# Patient Record
Sex: Female | Born: 1975 | Race: White | Hispanic: No | Marital: Married | Smoking: Never smoker
Health system: Southern US, Community
[De-identification: ages and names within clinical notes are randomized; demographics above are authoritative.]

## PROBLEM LIST (undated history)

## (undated) DIAGNOSIS — N6320 Unspecified lump in the left breast, unspecified quadrant: Secondary | ICD-10-CM

## (undated) DIAGNOSIS — N871 Moderate cervical dysplasia: Secondary | ICD-10-CM

## (undated) DIAGNOSIS — Z8742 Personal history of other diseases of the female genital tract: Secondary | ICD-10-CM

## (undated) DIAGNOSIS — Z9889 Other specified postprocedural states: Secondary | ICD-10-CM

## (undated) HISTORY — DX: Unspecified lump in the left breast, unspecified quadrant: N63.20

## (undated) HISTORY — DX: Moderate cervical dysplasia: N87.1

## (undated) HISTORY — DX: Personal history of other diseases of the female genital tract: Z87.42

---

## 2008-07-12 ENCOUNTER — Emergency Department: Payer: Self-pay | Admitting: Emergency Medicine

## 2012-05-11 LAB — HM HIV SCREENING LAB: HM HIV Screening: NEGATIVE

## 2012-12-05 HISTORY — PX: CERVICAL BIOPSY  W/ LOOP ELECTRODE EXCISION: SUR135

## 2015-10-08 ENCOUNTER — Other Ambulatory Visit: Payer: Self-pay | Admitting: Obstetrics and Gynecology

## 2015-10-08 DIAGNOSIS — N6320 Unspecified lump in the left breast, unspecified quadrant: Secondary | ICD-10-CM

## 2015-10-21 ENCOUNTER — Encounter (HOSPITAL_COMMUNITY): Payer: Self-pay

## 2015-10-21 ENCOUNTER — Ambulatory Visit
Admission: RE | Admit: 2015-10-21 | Discharge: 2015-10-21 | Disposition: A | Discharge status: Home / Self Care | Payer: Commercial Managed Care - PPO | Source: Ambulatory Visit | Attending: Obstetrics and Gynecology | Admitting: Obstetrics and Gynecology

## 2015-10-21 DIAGNOSIS — N6321 Unspecified lump in the left breast, upper outer quadrant: Secondary | ICD-10-CM | POA: Diagnosis present

## 2015-10-21 DIAGNOSIS — N6320 Unspecified lump in the left breast, unspecified quadrant: Secondary | ICD-10-CM

## 2015-11-10 ENCOUNTER — Other Ambulatory Visit: Payer: Self-pay | Admitting: Obstetrics and Gynecology

## 2015-11-10 DIAGNOSIS — N63 Unspecified lump in unspecified breast: Secondary | ICD-10-CM

## 2016-04-21 ENCOUNTER — Ambulatory Visit
Admission: RE | Admit: 2016-04-21 | Discharge: 2016-04-21 | Disposition: A | Discharge status: Home / Self Care | Payer: Commercial Managed Care - PPO | Source: Ambulatory Visit | Attending: Obstetrics and Gynecology | Admitting: Obstetrics and Gynecology

## 2016-04-21 DIAGNOSIS — N63 Unspecified lump in unspecified breast: Secondary | ICD-10-CM

## 2016-04-21 DIAGNOSIS — N6320 Unspecified lump in the left breast, unspecified quadrant: Secondary | ICD-10-CM | POA: Insufficient documentation

## 2016-04-25 NOTE — Progress Notes (Signed)
Message to Dr Glennon Mac to put orders in [facility].

## 2016-04-26 ENCOUNTER — Encounter: Payer: Self-pay | Admitting: Obstetrics and Gynecology

## 2016-04-26 ENCOUNTER — Other Ambulatory Visit: Payer: Self-pay | Admitting: Obstetrics and Gynecology

## 2016-04-26 ENCOUNTER — Telehealth: Payer: Self-pay | Admitting: Obstetrics and Gynecology

## 2016-04-26 DIAGNOSIS — N6321 Unspecified lump in the left breast, upper outer quadrant: Secondary | ICD-10-CM | POA: Insufficient documentation

## 2016-04-26 DIAGNOSIS — N6320 Unspecified lump in the left breast, unspecified quadrant: Secondary | ICD-10-CM

## 2016-04-26 HISTORY — DX: Unspecified lump in the left breast, unspecified quadrant: N63.20

## 2016-04-26 NOTE — Telephone Encounter (Signed)
Patient is aware of appointment at [facility] on [address], 10/25/16 @ 10:40am.

## 2016-05-19 ENCOUNTER — Ambulatory Visit (INDEPENDENT_AMBULATORY_CARE_PROVIDER_SITE_OTHER): Payer: Commercial Managed Care - PPO | Admitting: Certified Nurse Midwife

## 2016-05-19 ENCOUNTER — Encounter: Payer: Self-pay | Admitting: Certified Nurse Midwife

## 2016-05-19 ENCOUNTER — Other Ambulatory Visit: Payer: Self-pay | Admitting: Certified Nurse Midwife

## 2016-05-19 ENCOUNTER — Ambulatory Visit (INDEPENDENT_AMBULATORY_CARE_PROVIDER_SITE_OTHER): Payer: Commercial Managed Care - PPO

## 2016-05-19 VITALS — BP 122/72 | HR 101 | Ht 62.0 in | Wt 192.0 lb

## 2016-05-19 DIAGNOSIS — O3442 Maternal care for other abnormalities of cervix, second trimester: Secondary | ICD-10-CM | POA: Diagnosis not present

## 2016-05-19 DIAGNOSIS — O09529 Supervision of elderly multigravida, unspecified trimester: Secondary | ICD-10-CM | POA: Diagnosis not present

## 2016-05-19 DIAGNOSIS — R1030 Lower abdominal pain, unspecified: Secondary | ICD-10-CM

## 2016-05-19 DIAGNOSIS — Z3492 Encounter for supervision of normal pregnancy, unspecified, second trimester: Secondary | ICD-10-CM

## 2016-05-19 DIAGNOSIS — Z9889 Other specified postprocedural states: Secondary | ICD-10-CM

## 2016-05-19 DIAGNOSIS — O26842 Uterine size-date discrepancy, second trimester: Secondary | ICD-10-CM | POA: Diagnosis not present

## 2016-05-19 DIAGNOSIS — N926 Irregular menstruation, unspecified: Secondary | ICD-10-CM

## 2016-05-19 DIAGNOSIS — Z8279 Family history of other congenital malformations, deformations and chromosomal abnormalities: Secondary | ICD-10-CM

## 2016-05-19 LAB — POCT URINALYSIS DIPSTICK
Bilirubin, UA: NEGATIVE
Blood, UA: NEGATIVE
Glucose, UA: NEGATIVE
Ketones, UA: NEGATIVE
Leukocytes, UA: NEGATIVE
Nitrite, UA: NEGATIVE
Protein, UA: NEGATIVE
Spec Grav, UA: 1.01 (ref 1.010–1.025)
Urobilinogen, UA: NEGATIVE U/dL — AB
pH, UA: 6 (ref 5.0–8.0)

## 2016-05-19 LAB — POCT URINE PREGNANCY: Preg Test, Ur: POSITIVE — AB

## 2016-05-19 NOTE — Progress Notes (Signed)
Obstetrics & Gynecology Office Visit   Chief Complaint:  Chief Complaint  Patient presents with  . Back Pain    back pain and cramping, recent +UPT    History of Present Illness: 41 year old G 4P2012 with LMP the end of February? presents with a positive urine pregnancy test , back pain and cramping. Was having monthly menses until December at which time she began having more frequent menses and sometimes the menses would last 2 weeks. Is unsure of her LMP. Had a positive UPT 3 weeks ago. Since then has had bilateral SI joint pain, lower abdominal cramping and she thinks she is feeling baby moving. She has gained 20# since December, despite taking phenteramine for wight loss. Contraception has been condoms. Stopped phenteramine with + UPT. Started taking prenatal vitamins. Has not drunk an alcohol since February. Does not smoke.  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  Respiratory: Positive for cough. Negative for shortness of breath.   Cardiovascular: Negative for chest pain.  Gastrointestinal: Positive for abdominal pain (cramping). Negative for nausea and vomiting.  Genitourinary: Negative for dysuria and hematuria.  Musculoskeletal: Positive for back pain.  Skin: Negative for rash.  Neurological: Negative for dizziness and headaches.  Psychiatric/Behavioral: The patient is nervous/anxious.      Past Medical History:  Past Medical History:  Diagnosis Date  . CIN II (cervical intraepithelial neoplasia II)   . History of abnormal cervical Pap smear   . Left breast mass 04/26/2016    Past Surgical History:  Past Surgical History:  Procedure Laterality Date  . CERVICAL BIOPSY  W/ LOOP ELECTRODE EXCISION  12/05/2012   CONE LEEP    Gynecologic History: Patient's last menstrual period was 03/17/2016 (approximate).  Obstetric History: [id number] Obstetrical  History remarkable for spontaneous vaginal deliveries in 1997 and 2000 delivering 6#1oz and 7# daughters. Had mild PIH with first baby. Family History:  Family History  Problem Relation Age of Onset  . Breast cancer Maternal Grandmother 71    cancer of the gall bladder  . Colon cancer Mother 16  . Lung cancer Maternal Uncle     Social History:  Social History   Social History  . Marital status: Married    Spouse name: N/A  . Number of children: 2  . Years of education: N/A   Occupational History  . Not on file.   Social History Main Topics  . Smoking status: Never Smoker  . Smokeless tobacco: Never Used  . Alcohol use No  . Drug use: No  . Sexual activity: Yes    Birth control/ protection: None, Condom   Other Topics Concern  . Not on file   Social History Narrative  . No narrative on file    Allergies:  No Known Allergies  Medications: Prenatal vitamins. Stopped phenteramine 3 weeks ago Physical Exam Vitals: BP 122/72   Pulse (!) 101   Ht 5\' 2"  (1.575 m)   Wt 87.1 kg (192 lb)   LMP 03/17/2016 (Approximate) Comment: unsure dates  BMI 35.12 kg/m    Physical Exam  Constitutional: She is oriented to person, place, and time. She appears well-developed and well-nourished. No distress.  HENT:  Head: Normocephalic and atraumatic.  Neck: Normal range of motion.  Respiratory: Effort normal.  GI: Soft.  Fundus palpable above umbilicus. FH 22 cm. FHTs WNL. No hepatomegaly, NT  Musculoskeletal: Normal range of motion.  Mild tenderness in the SI joints bilaterlly  Neurological: She is alert and oriented to person, place, and time.  Skin: Skin is warm and dry. No rash noted.  Psychiatric: She has a normal mood and affect. Her behavior is normal. Thought content normal.   Results for orders placed or performed in visit on 05/19/16 (from the past 24 hour(s))  POCT urine pregnancy     Status: Abnormal   Collection Time: 05/19/16 10:21 AM  Result Value Ref Range    Preg Test, Ur Positive (A) Negative  POCT Urinalysis Dipstick     Status: Normal   Collection Time: 05/19/16 10:22 AM  Result Value Ref Range   Color, UA yellow    Clarity, UA clear    Glucose, UA neg    Bilirubin, UA neg    Ketones, UA neg    Spec Grav, UA 1.010 1.010 - 1.025   Blood, UA neg    pH, UA 6.0 5.0 - 8.0   Protein, UA neg    Urobilinogen, UA negative (A) 0.2 or 1.0 E.U./dL   Nitrite, UA neg    Leukocytes, UA Negative Negative   Assessment: 41 y.o. [id number] with late onset care Size of uterus c/w second trimester Advanced maternal age Hx of LEEP  Plan: Problem List Items Addressed This Visit    None    Visit Diagnoses    Missed period    -  Primary   Relevant Orders   POCT urine pregnancy (Completed)   POCT Urinalysis Dipstick (Completed)   US OB Comp + 14 Wk   Lower abdominal pain       Relevant Orders   POCT Urinalysis Dipstick (Completed)   Encounter for supervision of high risk multigravida of advanced maternal age, antepartum       Relevant Orders   RPR+Rh+ABO+Rub Ab+Ab Scr+CB...   Comprehensive metabolic panel   informaSeq(SM) with XY Analysis   US OB Comp + 14 Wk   History of LEEP (loop electrosurgical excision procedure) of cervix complicating pregnancy in second trimester       Relevant Orders   US OB Comp + 14 Wk   Size of fetus inconsistent with dates in second trimester       Relevant Orders   US OB Comp + 14 Wk     Ultrasound today reveals SIUP with CGA 18wk2 days and EDC=10/18/2016. Low lying placenta within 1 cm of cx os. Cervical length 5.32 cm  NOB labs today along with cell free DNA testing due to AMA Already scheduled for NOB H&P in 2 weeks. Will complete anatomy scan then and repeat cervical length. Refer to [facility] for fetal echo due to maternal age>40 and history of FOB's child by previous marriage with valvular abnormality not needing surgical repair and fetal exposure to phenteramine. Given handouts on diet, genetic testing, safe  medications. Dalia Heading, CNM

## 2016-05-20 ENCOUNTER — Encounter: Payer: Self-pay | Admitting: Certified Nurse Midwife

## 2016-05-20 DIAGNOSIS — O4442 Low lying placenta NOS or without hemorrhage, second trimester: Secondary | ICD-10-CM | POA: Insufficient documentation

## 2016-05-20 LAB — COMPREHENSIVE METABOLIC PANEL
ALK PHOS: 113 IU/L (ref 39–117)
ALT: 30 IU/L (ref 0–32)
AST: 19 IU/L (ref 0–40)
Albumin/Globulin Ratio: 1.1 — ABNORMAL LOW (ref 1.2–2.2)
Albumin: 3.2 g/dL — ABNORMAL LOW (ref 3.5–5.5)
BUN/Creatinine Ratio: 15 (ref 9–23)
BUN: 7 mg/dL (ref 6–24)
Bilirubin Total: <0.2 mg/dL (ref 0.0–1.2)
CALCIUM: 9.2 mg/dL (ref 8.7–10.2)
CO2: 21 mmol/L (ref 18–29)
Chloride: 105 mmol/L (ref 96–106)
Creatinine, Ser: 0.48 mg/dL — ABNORMAL LOW (ref 0.57–1.00)
GFR calc Af Amer: 141 mL/min/{1.73_m2} (ref >59)
GFR, EST NON AFRICAN AMERICAN: 122 mL/min/{1.73_m2} (ref >59)
GLOBULIN, TOTAL: 3 g/dL (ref 1.5–4.5)
GLUCOSE: 106 mg/dL — AB (ref 65–99)
Potassium: 3.9 mmol/L (ref 3.5–5.2)
Sodium: 141 mmol/L (ref 134–144)
Total Protein: 6.2 g/dL (ref 6.0–8.5)

## 2016-05-20 LAB — RPR+RH+ABO+RUB AB+AB SCR+CB...
ANTIBODY SCREEN: NEGATIVE
HEMATOCRIT: 32.3 % — AB (ref 34.0–46.6)
HIV Screen 4th Generation wRfx: NONREACTIVE
Hemoglobin: 10.8 g/dL — ABNORMAL LOW (ref 11.1–15.9)
Hepatitis B Surface Ag: NEGATIVE
MCH: 29.5 pg (ref 26.6–33.0)
MCHC: 33.4 g/dL (ref 31.5–35.7)
MCV: 88 fL (ref 79–97)
PLATELETS: 214 10*3/uL (ref 150–379)
RBC: 3.66 x10E6/uL — ABNORMAL LOW (ref 3.77–5.28)
RDW: 14.3 % (ref 12.3–15.4)
RH TYPE: POSITIVE
RPR: NONREACTIVE
Rubella Antibodies, IGG: 1.65 index (ref >0.99)
Varicella zoster IgG: 3049 index (ref >165)
WBC: 14.4 10*3/uL — ABNORMAL HIGH (ref 3.4–10.8)

## 2016-06-01 ENCOUNTER — Ambulatory Visit (INDEPENDENT_AMBULATORY_CARE_PROVIDER_SITE_OTHER): Payer: Commercial Managed Care - PPO | Admitting: Advanced Practice Midwife

## 2016-06-01 ENCOUNTER — Ambulatory Visit (INDEPENDENT_AMBULATORY_CARE_PROVIDER_SITE_OTHER): Payer: Commercial Managed Care - PPO

## 2016-06-01 VITALS — BP 124/74 | Wt 197.0 lb

## 2016-06-01 DIAGNOSIS — Z1379 Encounter for other screening for genetic and chromosomal anomalies: Secondary | ICD-10-CM

## 2016-06-01 DIAGNOSIS — O09529 Supervision of elderly multigravida, unspecified trimester: Secondary | ICD-10-CM

## 2016-06-01 DIAGNOSIS — Z3A20 20 weeks gestation of pregnancy: Secondary | ICD-10-CM

## 2016-06-01 DIAGNOSIS — Z113 Encounter for screening for infections with a predominantly sexual mode of transmission: Secondary | ICD-10-CM

## 2016-06-01 DIAGNOSIS — N926 Irregular menstruation, unspecified: Secondary | ICD-10-CM

## 2016-06-01 DIAGNOSIS — O3442 Maternal care for other abnormalities of cervix, second trimester: Secondary | ICD-10-CM | POA: Diagnosis not present

## 2016-06-01 DIAGNOSIS — Z0489 Encounter for examination and observation for other specified reasons: Secondary | ICD-10-CM

## 2016-06-01 DIAGNOSIS — Z9889 Other specified postprocedural states: Secondary | ICD-10-CM

## 2016-06-01 DIAGNOSIS — IMO0002 Reserved for concepts with insufficient information to code with codable children: Secondary | ICD-10-CM

## 2016-06-01 DIAGNOSIS — Z048 Encounter for examination and observation for other specified reasons: Secondary | ICD-10-CM

## 2016-06-01 DIAGNOSIS — O26842 Uterine size-date discrepancy, second trimester: Secondary | ICD-10-CM

## 2016-06-01 NOTE — Progress Notes (Signed)
Anatomy scan today incomplete- unable to see report as of this note. F/U scan nv.

## 2016-06-04 LAB — GC/CHLAMYDIA PROBE AMP
CHLAMYDIA, DNA PROBE: NEGATIVE
Neisseria gonorrhoeae by PCR: NEGATIVE

## 2016-06-07 LAB — INFORMASEQ(SM) WITH XY ANALYSIS
Fetal Fraction (%):: 5.8
Fetal Number: 1
Gestational Age at Collection: 20.1 wk
Weight: 197 [lb_av]

## 2016-06-08 ENCOUNTER — Ambulatory Visit: Payer: Self-pay | Admitting: Certified Nurse Midwife

## 2016-06-15 ENCOUNTER — Telehealth: Payer: Self-pay | Admitting: Obstetrics and Gynecology

## 2016-06-15 NOTE — Telephone Encounter (Signed)
Pt is calling to find out about appointment possibly at [place] [facility] for ultrasound of the Heart of her baby. Would you please advise. CB# [phone]

## 2016-06-15 NOTE — Telephone Encounter (Signed)
Pt aware of appt for tomorrow morning for fetal echo in [place]. Needed address again.

## 2016-06-29 ENCOUNTER — Encounter: Payer: Commercial Managed Care - PPO | Admitting: Obstetrics and Gynecology

## 2016-06-29 ENCOUNTER — Other Ambulatory Visit: Payer: Commercial Managed Care - PPO

## 2016-06-30 ENCOUNTER — Ambulatory Visit (INDEPENDENT_AMBULATORY_CARE_PROVIDER_SITE_OTHER): Payer: Commercial Managed Care - PPO

## 2016-06-30 ENCOUNTER — Ambulatory Visit (INDEPENDENT_AMBULATORY_CARE_PROVIDER_SITE_OTHER): Payer: Commercial Managed Care - PPO | Admitting: Obstetrics and Gynecology

## 2016-06-30 VITALS — BP 146/88 | Wt 206.0 lb

## 2016-06-30 DIAGNOSIS — Z362 Encounter for other antenatal screening follow-up: Secondary | ICD-10-CM | POA: Diagnosis not present

## 2016-06-30 DIAGNOSIS — Z3A24 24 weeks gestation of pregnancy: Secondary | ICD-10-CM

## 2016-06-30 DIAGNOSIS — Z0489 Encounter for examination and observation for other specified reasons: Secondary | ICD-10-CM

## 2016-06-30 DIAGNOSIS — Z131 Encounter for screening for diabetes mellitus: Secondary | ICD-10-CM

## 2016-06-30 DIAGNOSIS — O3442 Maternal care for other abnormalities of cervix, second trimester: Secondary | ICD-10-CM

## 2016-06-30 DIAGNOSIS — O09529 Supervision of elderly multigravida, unspecified trimester: Secondary | ICD-10-CM

## 2016-06-30 DIAGNOSIS — O4442 Low lying placenta NOS or without hemorrhage, second trimester: Secondary | ICD-10-CM

## 2016-06-30 DIAGNOSIS — Z9889 Other specified postprocedural states: Secondary | ICD-10-CM

## 2016-06-30 DIAGNOSIS — Z113 Encounter for screening for infections with a predominantly sexual mode of transmission: Secondary | ICD-10-CM

## 2016-06-30 DIAGNOSIS — IMO0002 Reserved for concepts with insufficient information to code with codable children: Secondary | ICD-10-CM

## 2016-06-30 NOTE — Progress Notes (Signed)
Repeat anatomy scan today, now complete. No vb. No lof.  RVOT not seen, but normal on ECHO.  28 wk labs nv

## 2016-07-28 ENCOUNTER — Other Ambulatory Visit: Payer: Commercial Managed Care - PPO

## 2016-07-28 ENCOUNTER — Ambulatory Visit (INDEPENDENT_AMBULATORY_CARE_PROVIDER_SITE_OTHER): Payer: Commercial Managed Care - PPO | Admitting: Certified Nurse Midwife

## 2016-07-28 VITALS — BP 108/68 | Wt 209.0 lb

## 2016-07-28 DIAGNOSIS — Z113 Encounter for screening for infections with a predominantly sexual mode of transmission: Secondary | ICD-10-CM

## 2016-07-28 DIAGNOSIS — O09529 Supervision of elderly multigravida, unspecified trimester: Secondary | ICD-10-CM

## 2016-07-28 DIAGNOSIS — Z131 Encounter for screening for diabetes mellitus: Secondary | ICD-10-CM

## 2016-07-28 DIAGNOSIS — Z3A28 28 weeks gestation of pregnancy: Secondary | ICD-10-CM

## 2016-07-28 NOTE — Progress Notes (Signed)
28 week labs today (O POS) Some BH contractions Thinking about breast feeding/ encouraged to attend breast feeding classes Good FM ROB and TDAP in 2 weeks Growth scan and ROB in 4 weeks

## 2016-07-28 NOTE — Progress Notes (Signed)
28 wk labs today. Pt c/o pressure in lower stomach when trying to walk for exercise.

## 2016-07-29 LAB — 28 WEEK RH+PANEL
BASOS: 0 %
Basophils Absolute: 0 10*3/uL (ref 0.0–0.2)
EOS (ABSOLUTE): 0.2 10*3/uL (ref 0.0–0.4)
EOS: 2 %
Gestational Diabetes Screen: 132 mg/dL (ref 65–139)
HEMOGLOBIN: 10.7 g/dL — AB (ref 11.1–15.9)
HIV SCREEN 4TH GENERATION: NONREACTIVE
Hematocrit: 32.3 % — ABNORMAL LOW (ref 34.0–46.6)
IMMATURE GRANS (ABS): 0.1 10*3/uL (ref 0.0–0.1)
Immature Granulocytes: 1 %
Lymphocytes Absolute: 2.7 10*3/uL (ref 0.7–3.1)
Lymphs: 21 %
MCH: 30 pg (ref 26.6–33.0)
MCHC: 33.1 g/dL (ref 31.5–35.7)
MCV: 91 fL (ref 79–97)
MONOCYTES: 5 %
Monocytes Absolute: 0.6 10*3/uL (ref 0.1–0.9)
NEUTROS ABS: 9.5 10*3/uL — AB (ref 1.4–7.0)
Neutrophils: 71 %
Platelets: 205 10*3/uL (ref 150–379)
RBC: 3.57 x10E6/uL — ABNORMAL LOW (ref 3.77–5.28)
RDW: 14 % (ref 12.3–15.4)
RPR Ser Ql: NONREACTIVE
WBC: 13.1 10*3/uL — AB (ref 3.4–10.8)

## 2016-08-12 ENCOUNTER — Ambulatory Visit (INDEPENDENT_AMBULATORY_CARE_PROVIDER_SITE_OTHER): Payer: Commercial Managed Care - PPO | Admitting: Obstetrics and Gynecology

## 2016-08-12 VITALS — BP 112/62 | Wt 217.0 lb

## 2016-08-12 DIAGNOSIS — Z23 Encounter for immunization: Secondary | ICD-10-CM

## 2016-08-12 DIAGNOSIS — O09529 Supervision of elderly multigravida, unspecified trimester: Secondary | ICD-10-CM

## 2016-08-12 DIAGNOSIS — Z3A30 30 weeks gestation of pregnancy: Secondary | ICD-10-CM

## 2016-08-12 NOTE — Progress Notes (Signed)
Vaginal pressure Braxton hicks  TDAP/Blood form signed

## 2016-08-25 ENCOUNTER — Ambulatory Visit (INDEPENDENT_AMBULATORY_CARE_PROVIDER_SITE_OTHER): Payer: Commercial Managed Care - PPO

## 2016-08-25 ENCOUNTER — Ambulatory Visit (INDEPENDENT_AMBULATORY_CARE_PROVIDER_SITE_OTHER): Payer: Commercial Managed Care - PPO | Admitting: Obstetrics & Gynecology

## 2016-08-25 DIAGNOSIS — Z362 Encounter for other antenatal screening follow-up: Secondary | ICD-10-CM

## 2016-08-25 DIAGNOSIS — O09529 Supervision of elderly multigravida, unspecified trimester: Secondary | ICD-10-CM

## 2016-08-25 DIAGNOSIS — O403XX0 Polyhydramnios, third trimester, not applicable or unspecified: Secondary | ICD-10-CM | POA: Insufficient documentation

## 2016-08-25 NOTE — Patient Instructions (Signed)
Third Trimester of Pregnancy The third trimester is from week 28 through week 40 (months 7 through 9). The third trimester is a time when the unborn baby (fetus) is growing rapidly. At the end of the ninth month, the fetus is about 20 inches in length and weighs 6-10 pounds. Body changes during your third trimester Your body will continue to go through many changes during pregnancy. The changes vary from woman to woman. During the third trimester:  Your weight will continue to increase. You can expect to gain 25-35 pounds (11-16 kg) by the end of the pregnancy.  You may begin to get stretch marks on your hips, abdomen, and breasts.  You may urinate more often because the fetus is moving lower into your pelvis and pressing on your bladder.  You may develop or continue to have heartburn. This is caused by increased hormones that slow down muscles in the digestive tract.  You may develop or continue to have constipation because increased hormones slow digestion and cause the muscles that push waste through your intestines to relax.  You may develop hemorrhoids. These are swollen veins (varicose veins) in the rectum that can itch or be painful.  You may develop swollen, bulging veins (varicose veins) in your legs.  You may have increased body aches in the pelvis, back, or thighs. This is due to weight gain and increased hormones that are relaxing your joints.  You may have changes in your hair. These can include thickening of your hair, rapid growth, and changes in texture. Some women also have hair loss during or after pregnancy, or hair that feels dry or thin. Your hair will most likely return to normal after your baby is born.  Your breasts will continue to grow and they will continue to become tender. A yellow fluid (colostrum) may leak from your breasts. This is the first milk you are producing for your baby.  Your belly button may stick out.  You may notice more swelling in your hands,  face, or ankles.  You may have increased tingling or numbness in your hands, arms, and legs. The skin on your belly may also feel numb.  You may feel short of breath because of your expanding uterus.  You may have more problems sleeping. This can be caused by the size of your belly, increased need to urinate, and an increase in your body's metabolism.  You may notice the fetus "dropping," or moving lower in your abdomen (lightening).  You may have increased vaginal discharge.  You may notice your joints feel loose and you may have pain around your pelvic bone.  What to expect at prenatal visits You will have prenatal exams every 2 weeks until week 36. Then you will have weekly prenatal exams. During a routine prenatal visit:  You will be weighed to make sure you and the baby are growing normally.  Your blood pressure will be taken.  Your abdomen will be measured to track your baby's growth.  The fetal heartbeat will be listened to.  Any test results from the previous visit will be discussed.  You may have a cervical check near your due date to see if your cervix has softened or thinned (effaced).  You will be tested for Group B streptococcus. This happens between 35 and 37 weeks.  Your health care provider may ask you:  What your birth plan is.  How you are feeling.  If you are feeling the baby move.  If you have had   any abnormal symptoms, such as leaking fluid, bleeding, severe headaches, or abdominal cramping.  If you are using any tobacco products, including cigarettes, chewing tobacco, and electronic cigarettes.  If you have any questions.  Other tests or screenings that may be performed during your third trimester include:  Blood tests that check for low iron levels (anemia).  Fetal testing to check the health, activity level, and growth of the fetus. Testing is done if you have certain medical conditions or if there are problems during the  pregnancy.  Nonstress test (NST). This test checks the health of your baby to make sure there are no signs of problems, such as the baby not getting enough oxygen. During this test, a belt is placed around your belly. The baby is made to move, and its heart rate is monitored during movement.  What is false labor? False labor is a condition in which you feel small, irregular tightenings of the muscles in the womb (contractions) that usually go away with rest, changing position, or drinking water. These are called Braxton Hicks contractions. Contractions may last for hours, days, or even weeks before true labor sets in. If contractions come at regular intervals, become more frequent, increase in intensity, or become painful, you should see your health care provider. What are the signs of labor?  Abdominal cramps.  Regular contractions that start at 10 minutes apart and become stronger and more frequent with time.  Contractions that start on the top of the uterus and spread down to the lower abdomen and back.  Increased pelvic pressure and dull back pain.  A watery or bloody mucus discharge that comes from the vagina.  Leaking of amniotic fluid. This is also known as your "water breaking." It could be a slow trickle or a gush. Let your health care provider know if it has a color or strange odor. If you have any of these signs, call your health care provider right away, even if it is before your due date. Follow these instructions at home: Medicines  Follow your health care provider's instructions regarding medicine use. Specific medicines may be either safe or unsafe to take during pregnancy.  Take a prenatal vitamin that contains at least 600 micrograms (mcg) of folic acid.  If you develop constipation, try taking a stool softener if your health care provider approves. Eating and drinking  Eat a balanced diet that includes fresh fruits and vegetables, whole grains, good sources of protein  such as meat, eggs, or tofu, and low-fat dairy. Your health care provider will help you determine the amount of weight gain that is right for you.  Avoid raw meat and uncooked cheese. These carry germs that can cause birth defects in the baby.  If you have low calcium intake from food, talk to your health care provider about whether you should take a daily calcium supplement.  Eat four or five small meals rather than three large meals a day.  Limit foods that are high in fat and processed sugars, such as fried and sweet foods.  To prevent constipation: ? Drink enough fluid to keep your urine clear or pale yellow. ? Eat foods that are high in fiber, such as fresh fruits and vegetables, whole grains, and beans. Activity  Exercise only as directed by your health care provider. Most women can continue their usual exercise routine during pregnancy. Try to exercise for 30 minutes at least 5 days a week. Stop exercising if you experience uterine contractions.  Avoid heavy   lifting.  Do not exercise in extreme heat or humidity, or at high altitudes.  Wear low-heel, comfortable shoes.  Practice good posture.  You may continue to have sex unless your health care provider tells you otherwise. Relieving pain and discomfort  Take frequent breaks and rest with your legs elevated if you have leg cramps or low back pain.  Take warm sitz baths to soothe any pain or discomfort caused by hemorrhoids. Use hemorrhoid cream if your health care provider approves.  Wear a good support bra to prevent discomfort from breast tenderness.  If you develop varicose veins: ? Wear support pantyhose or compression stockings as told by your healthcare provider. ? Elevate your feet for 15 minutes, 3-4 times a day. Prenatal care  Write down your questions. Take them to your prenatal visits.  Keep all your prenatal visits as told by your health care provider. This is important. Safety  Wear your seat belt at  all times when driving.  Make a list of emergency phone numbers, including numbers for family, friends, the hospital, and police and fire departments. General instructions  Avoid cat litter boxes and soil used by cats. These carry germs that can cause birth defects in the baby. If you have a cat, ask someone to clean the litter box for you.  Do not travel far distances unless it is absolutely necessary and only with the approval of your health care provider.  Do not use hot tubs, steam rooms, or saunas.  Do not drink alcohol.  Do not use any products that contain nicotine or tobacco, such as cigarettes and e-cigarettes. If you need help quitting, ask your health care provider.  Do not use any medicinal herbs or unprescribed drugs. These chemicals affect the formation and growth of the baby.  Do not douche or use tampons or scented sanitary pads.  Do not cross your legs for long periods of time.  To prepare for the arrival of your baby: ? Take prenatal classes to understand, practice, and ask questions about labor and delivery. ? Make a trial run to the hospital. ? Visit the hospital and tour the maternity area. ? Arrange for maternity or paternity leave through employers. ? Arrange for family and friends to take care of pets while you are in the hospital. ? Purchase a rear-facing car seat and make sure you know how to install it in your car. ? Pack your hospital bag. ? Prepare the baby's nursery. Make sure to remove all pillows and stuffed animals from the baby's crib to prevent suffocation.  Visit your dentist if you have not gone during your pregnancy. Use a soft toothbrush to brush your teeth and be gentle when you floss. Contact a health care provider if:  You are unsure if you are in labor or if your water has broken.  You become dizzy.  You have mild pelvic cramps, pelvic pressure, or nagging pain in your abdominal area.  You have lower back pain.  You have persistent  nausea, vomiting, or diarrhea.  You have an unusual or bad smelling vaginal discharge.  You have pain when you urinate. Get help right away if:  Your water breaks before 37 weeks.  You have regular contractions less than 5 minutes apart before 37 weeks.  You have a fever.  You are leaking fluid from your vagina.  You have spotting or bleeding from your vagina.  You have severe abdominal pain or cramping.  You have rapid weight loss or weight gain.    You have shortness of breath with chest pain.  You notice sudden or extreme swelling of your face, hands, ankles, feet, or legs.  Your baby makes fewer than 10 movements in 2 hours.  You have severe headaches that do not go away when you take medicine.  You have vision changes. Summary  The third trimester is from week 28 through week 40, months 7 through 9. The third trimester is a time when the unborn baby (fetus) is growing rapidly.  During the third trimester, your discomfort may increase as you and your baby continue to gain weight. You may have abdominal, leg, and back pain, sleeping problems, and an increased need to urinate.  During the third trimester your breasts will keep growing and they will continue to become tender. A yellow fluid (colostrum) may leak from your breasts. This is the first milk you are producing for your baby.  False labor is a condition in which you feel small, irregular tightenings of the muscles in the womb (contractions) that eventually go away. These are called Braxton Hicks contractions. Contractions may last for hours, days, or even weeks before true labor sets in.  Signs of labor can include: abdominal cramps; regular contractions that start at 10 minutes apart and become stronger and more frequent with time; watery or bloody mucus discharge that comes from the vagina; increased pelvic pressure and dull back pain; and leaking of amniotic fluid. This information is not intended to replace advice  given to you by your health care provider. Make sure you discuss any questions you have with your health care provider. Document Released: 12/28/2000 Document Revised: 06/11/2015 Document Reviewed: 03/06/2012 Elsevier Interactive Patient Education  2017 [employer]  

## 2016-08-25 NOTE — Progress Notes (Signed)
Growth U/S discussed.    Review of ULTRASOUND.    I have personally reviewed images and report of recent ultrasound done at [facility].    Plan of management to be discussed with patient.    Breech interventions discussed.    Polyhydramnios discussed.  Repeat US to monitor.  Vaginal pressure and heartburn discussed. Labor precautions PNV, [place].

## 2016-09-08 ENCOUNTER — Ambulatory Visit (INDEPENDENT_AMBULATORY_CARE_PROVIDER_SITE_OTHER): Payer: Commercial Managed Care - PPO

## 2016-09-08 ENCOUNTER — Ambulatory Visit (INDEPENDENT_AMBULATORY_CARE_PROVIDER_SITE_OTHER): Payer: Commercial Managed Care - PPO | Admitting: Obstetrics & Gynecology

## 2016-09-08 VITALS — BP 148/82 | Wt 226.0 lb

## 2016-09-08 DIAGNOSIS — O403XX0 Polyhydramnios, third trimester, not applicable or unspecified: Secondary | ICD-10-CM

## 2016-09-08 DIAGNOSIS — Z9889 Other specified postprocedural states: Secondary | ICD-10-CM

## 2016-09-08 DIAGNOSIS — O10913 Unspecified pre-existing hypertension complicating pregnancy, third trimester: Secondary | ICD-10-CM

## 2016-09-08 DIAGNOSIS — O3442 Maternal care for other abnormalities of cervix, second trimester: Secondary | ICD-10-CM

## 2016-09-08 DIAGNOSIS — Z3A34 34 weeks gestation of pregnancy: Secondary | ICD-10-CM

## 2016-09-08 DIAGNOSIS — O09529 Supervision of elderly multigravida, unspecified trimester: Secondary | ICD-10-CM

## 2016-09-08 NOTE — Patient Instructions (Signed)
Hypertension During Pregnancy °Hypertension, commonly called high blood pressure, is when the force of blood pumping through your arteries is too strong. Arteries are blood vessels that carry blood from the heart throughout the body. Hypertension during pregnancy can cause problems for you and your baby. Your baby may be born early (prematurely) or may not weigh as much as he or she should at birth. Very bad cases of hypertension during pregnancy can be life-threatening. °Different types of hypertension can occur during pregnancy. These include: °· Chronic hypertension. This happens when: °? You have hypertension before pregnancy and it continues during pregnancy. °? You develop hypertension before you are [redacted] weeks pregnant, and it continues during pregnancy. °· Gestational hypertension. This is hypertension that develops after the 20th week of pregnancy. °· Preeclampsia, also called toxemia of pregnancy. This is a very serious type of hypertension that develops only during pregnancy. It affects the whole body, and it can be very dangerous for you and your baby. ° °Gestational hypertension and preeclampsia usually go away within 6 weeks after your baby is born. Women who have hypertension during pregnancy have a greater chance of developing hypertension later in life or during future pregnancies. °What are the causes? °The exact cause of hypertension is not known. °What increases the risk? °There are certain factors that make it more likely for you to develop hypertension during pregnancy. These include: °· Having hypertension during a previous pregnancy or prior to pregnancy. °· Being overweight. °· Being older than age 40. °· Being pregnant for the first time or being pregnant with more than one baby. °· Becoming pregnant using fertilization methods such as IVF (in vitro fertilization). °· Having diabetes, kidney problems, or systemic lupus erythematosus. °· Having a family history of hypertension. ° °What are the  signs or symptoms? °Chronic hypertension and gestational hypertension rarely cause symptoms. Preeclampsia causes symptoms, which may include: °· Increased protein in your urine. Your health care provider will check for this at every visit before you give birth (prenatal visit). °· Severe headaches. °· Sudden weight gain. °· Swelling of the hands, face, legs, and feet. °· Nausea and vomiting. °· Vision problems, such as blurred or double vision. °· Numbness in the face, arms, legs, and feet. °· Dizziness. °· Slurred speech. °· Sensitivity to bright lights. °· Abdominal pain. °· Convulsions. ° °How is this diagnosed? °You may be diagnosed with hypertension during a routine prenatal exam. At each prenatal visit, you may: °· Have a urine test to check for high amounts of protein in your urine. °· Have your blood pressure checked. A blood pressure reading is recorded as two numbers, such as "120 over 80" (or 120/80). The first ("top") number is called the systolic pressure. It is a measure of the pressure in your arteries when your heart beats. The second ("bottom") number is called the diastolic pressure. It is a measure of the pressure in your arteries as your heart relaxes between beats. Blood pressure is measured in a unit called mm Hg. A normal blood pressure reading is: °? Systolic: below 120. °? Diastolic: below 80. ° °The type of hypertension that you are diagnosed with depends on your test results and when your symptoms developed. °· Chronic hypertension is usually diagnosed before 20 weeks of pregnancy. °· Gestational hypertension is usually diagnosed after 20 weeks of pregnancy. °· Hypertension with high amounts of protein in the urine is diagnosed as preeclampsia. °· Blood pressure measurements that stay above 160 systolic, or above 110 diastolic, are   signs of severe preeclampsia. ° °How is this treated? °Treatment for hypertension during pregnancy varies depending on the type of hypertension you have and how  serious it is. °· If you take medicines called ACE inhibitors to treat chronic hypertension, you may need to switch medicines. ACE inhibitors should not be taken during pregnancy. °· If you have gestational hypertension, you may need to take blood pressure medicine. °· If you are at risk for preeclampsia, your health care provider may recommend that you take a low-dose aspirin every day to prevent high blood pressure during your pregnancy. °· If you have severe preeclampsia, you may need to be hospitalized so you and your baby can be monitored closely. You may also need to take medicine (magnesium sulfate) to prevent seizures and to lower blood pressure. This medicine may be given as an injection or through an IV tube. °· In some cases, if your condition gets worse, you may need to deliver your baby early. ° °Follow these instructions at home: °Eating and drinking °· Drink enough fluid to keep your urine clear or pale yellow. °· Eat a healthy diet that is low in salt (sodium). Do not add salt to your food. Check food labels to see how much sodium a food or beverage contains. °Lifestyle °· Do not use any products that contain nicotine or tobacco, such as cigarettes and e-cigarettes. If you need help quitting, ask your health care provider. °· Do not use alcohol. °· Avoid caffeine. °· Avoid stress as much as possible. Rest and get plenty of sleep. °General instructions °· Take over-the-counter and prescription medicines only as told by your health care provider. °· While lying down, lie on your left side. This keeps pressure off your baby. °· While sitting or lying down, raise (elevate) your feet. Try putting some pillows under your lower legs. °· Exercise regularly. Ask your health care provider what kinds of exercise are best for you. °· Keep all prenatal and follow-up visits as told by your health care provider. This is important. °Contact a health care provider if: °· You have symptoms that your health care  provider told you may require more treatment or monitoring, such as: °? Fever. °? Vomiting. °? Headache. °Get help right away if: °· You have severe abdominal pain or vomiting that does not get better with treatment. °· You suddenly develop swelling in your hands, ankles, or face. °· You gain 4 lbs (1.8 kg) or more in 1 week. °· You develop vaginal bleeding, or you have blood in your urine. °· You do not feel your baby moving as much as usual. °· You have blurred or double vision. °· You have muscle twitching or sudden tightening (spasms). °· You have shortness of breath. °· Your lips or fingernails turn blue. °This information is not intended to replace advice given to you by your health care provider. Make sure you discuss any questions you have with your health care provider. °Document Released: 09/21/2010 Document Revised: 07/24/2015 Document Reviewed: 06/19/2015 °Elsevier Interactive Patient Education © 2018 [employer] ° °

## 2016-09-08 NOTE — Progress Notes (Signed)
Mild HTN today, no s/sx preeclampsia.  Labs today; sx's to watch for discussed. Polyhydramnios worsened; DP consult for management  Review of ULTRASOUND.    I have personally reviewed images and report of recent ultrasound done at [facility].    Plan of management to be discussed with patient.

## 2016-09-09 LAB — COMPREHENSIVE METABOLIC PANEL
A/G RATIO: 1.1 — AB (ref 1.2–2.2)
ALBUMIN: 3.3 g/dL — AB (ref 3.5–5.5)
ALT: 9 IU/L (ref 0–32)
AST: 15 IU/L (ref 0–40)
Alkaline Phosphatase: 157 IU/L — ABNORMAL HIGH (ref 39–117)
BILIRUBIN TOTAL: 0.2 mg/dL (ref 0.0–1.2)
BUN / CREAT RATIO: 15 (ref 9–23)
BUN: 8 mg/dL (ref 6–24)
CALCIUM: 9.6 mg/dL (ref 8.7–10.2)
CHLORIDE: 102 mmol/L (ref 96–106)
CO2: 21 mmol/L (ref 20–29)
Creatinine, Ser: 0.53 mg/dL — ABNORMAL LOW (ref 0.57–1.00)
GFR, EST AFRICAN AMERICAN: 136 mL/min/{1.73_m2} (ref >59)
GFR, EST NON AFRICAN AMERICAN: 118 mL/min/{1.73_m2} (ref >59)
GLOBULIN, TOTAL: 3.1 g/dL (ref 1.5–4.5)
Glucose: 74 mg/dL (ref 65–99)
Potassium: 3.9 mmol/L (ref 3.5–5.2)
Sodium: 137 mmol/L (ref 134–144)
Total Protein: 6.4 g/dL (ref 6.0–8.5)

## 2016-09-09 LAB — CBC
HEMATOCRIT: 33.4 % — AB (ref 34.0–46.6)
HEMOGLOBIN: 11.1 g/dL (ref 11.1–15.9)
MCH: 29 pg (ref 26.6–33.0)
MCHC: 33.2 g/dL (ref 31.5–35.7)
MCV: 87 fL (ref 79–97)
Platelets: 209 10*3/uL (ref 150–379)
RBC: 3.83 x10E6/uL (ref 3.77–5.28)
RDW: 13.5 % (ref 12.3–15.4)
WBC: 12 10*3/uL — ABNORMAL HIGH (ref 3.4–10.8)

## 2016-09-09 LAB — PROTEIN / CREATININE RATIO, URINE
Creatinine, Urine: 156 mg/dL
PROTEIN/CREAT RATIO: 112 mg/g{creat} (ref 0–200)
Protein, Ur: 17.5 mg/dL

## 2016-09-09 NOTE — Progress Notes (Signed)
Plz ensure appt for DP Mon or thurs at latest.  Let her know labs were normal.  I have not seen urine test yet though.  Have her let us know (or come to L&D) if has headache worsening, chest pain, SOB, blurry vision, or blood pressure elevation (if checks at home or other location).  Thx.

## 2016-09-12 ENCOUNTER — Other Ambulatory Visit: Payer: Self-pay | Admitting: *Deleted

## 2016-09-12 ENCOUNTER — Telehealth: Payer: Self-pay | Admitting: Obstetrics & Gynecology

## 2016-09-12 DIAGNOSIS — O409XX0 Polyhydramnios, unspecified trimester, not applicable or unspecified: Secondary | ICD-10-CM

## 2016-09-12 NOTE — Telephone Encounter (Signed)
Pt aware of appt for DPN on Thursday. She did not receive message left earlier. Pt's husband voicemail not set up. Also spoke to [facility] since pt was unable to go today and he requested that she come in tomorrow morning to see Jaci at 8:30 to check BP and that will determine if pt can go back to work prior to ultrasound on Thursday.

## 2016-09-12 NOTE — Telephone Encounter (Signed)
Pt's hsb, Lindsay Jennings, calling.  Pt has too much fluid, we were supposed to set her up an appt for today but we didn't.  [place] wrote her a note for work.  They were both out of work today b/c of appt which wasn't made.  They want to know what's going on.  [phone]

## 2016-09-12 NOTE — Telephone Encounter (Signed)
Patient calling about appt she is supposed to have set up today or Thurs with DP.    She has not heard from anyone with an appt.  Please call pt [phone].

## 2016-09-12 NOTE — Telephone Encounter (Signed)
Left msg for patient with detailed appt info. Pt to go to [facility] on Thursday 09/15/16 at 3:00. Requested pt to call back to confirm appt.

## 2016-09-13 ENCOUNTER — Ambulatory Visit (INDEPENDENT_AMBULATORY_CARE_PROVIDER_SITE_OTHER): Payer: Commercial Managed Care - PPO | Admitting: Maternal Newborn

## 2016-09-13 VITALS — BP 136/82 | Wt 227.0 lb

## 2016-09-13 DIAGNOSIS — O09529 Supervision of elderly multigravida, unspecified trimester: Secondary | ICD-10-CM

## 2016-09-13 DIAGNOSIS — Z3A35 35 weeks gestation of pregnancy: Secondary | ICD-10-CM

## 2016-09-13 DIAGNOSIS — O403XX0 Polyhydramnios, third trimester, not applicable or unspecified: Secondary | ICD-10-CM

## 2016-09-13 NOTE — Progress Notes (Signed)
  B/P improved today. Pt c/o pain c/w round ligament pain. Comfort measures advised. Denies spots or floaters in vision. Does c/o some blurred vision/ mild headaches. Urine P/C ratio ordered. Preeclampsia precautions reviewed. Reinforced to come here or L/D if home BP checks are elevated or sx of preeclampsia. Appointment with DP on Thursday.

## 2016-09-14 LAB — PROTEIN / CREATININE RATIO, URINE
CREATININE, UR: 167.5 mg/dL
PROTEIN UR: 19.2 mg/dL
Protein/Creat Ratio: 115 mg/g creat (ref 0–200)

## 2016-09-15 ENCOUNTER — Ambulatory Visit
Admission: RE | Admit: 2016-09-15 | Discharge: 2016-09-15 | Disposition: A | Discharge status: Home / Self Care | Payer: Commercial Managed Care - PPO | Source: Ambulatory Visit | Attending: Maternal & Fetal Medicine | Admitting: Maternal & Fetal Medicine

## 2016-09-15 ENCOUNTER — Other Ambulatory Visit: Payer: Self-pay | Admitting: *Deleted

## 2016-09-15 DIAGNOSIS — O365930 Maternal care for other known or suspected poor fetal growth, third trimester, not applicable or unspecified: Secondary | ICD-10-CM

## 2016-09-15 DIAGNOSIS — O09523 Supervision of elderly multigravida, third trimester: Secondary | ICD-10-CM | POA: Insufficient documentation

## 2016-09-15 DIAGNOSIS — O409XX0 Polyhydramnios, unspecified trimester, not applicable or unspecified: Secondary | ICD-10-CM

## 2016-09-15 DIAGNOSIS — Z3A35 35 weeks gestation of pregnancy: Secondary | ICD-10-CM | POA: Insufficient documentation

## 2016-09-15 DIAGNOSIS — O4442 Low lying placenta NOS or without hemorrhage, second trimester: Secondary | ICD-10-CM

## 2016-09-15 DIAGNOSIS — O403XX0 Polyhydramnios, third trimester, not applicable or unspecified: Secondary | ICD-10-CM | POA: Insufficient documentation

## 2016-09-15 HISTORY — DX: Other specified postprocedural states: Z98.890

## 2016-09-16 ENCOUNTER — Ambulatory Visit (INDEPENDENT_AMBULATORY_CARE_PROVIDER_SITE_OTHER): Payer: Commercial Managed Care - PPO | Admitting: Advanced Practice Midwife

## 2016-09-16 VITALS — BP 128/84 | Wt 226.0 lb

## 2016-09-16 DIAGNOSIS — Z3A35 35 weeks gestation of pregnancy: Secondary | ICD-10-CM

## 2016-09-16 DIAGNOSIS — O403XX0 Polyhydramnios, third trimester, not applicable or unspecified: Secondary | ICD-10-CM

## 2016-09-16 DIAGNOSIS — O09529 Supervision of elderly multigravida, unspecified trimester: Secondary | ICD-10-CM

## 2016-09-16 NOTE — Progress Notes (Signed)
Patient was seen at [facility] yesterday.  BPP 8/8 Fetal anatomy within normal limits Most likely idiopathic polyhydramnios AFI Measurement was 30.13 cm yesterday  Recommendations from DP: Weekly AFI/NST Delivery at 37 weeks due to patient intolerance of increased fluid- difficulty breathing, increased pressure  ROB in 4-5 days with AFI, NST, GBS IOL at 37 weeks 9/11 at 8am

## 2016-09-23 ENCOUNTER — Ambulatory Visit (INDEPENDENT_AMBULATORY_CARE_PROVIDER_SITE_OTHER): Payer: Commercial Managed Care - PPO | Admitting: Maternal Newborn

## 2016-09-23 ENCOUNTER — Ambulatory Visit (INDEPENDENT_AMBULATORY_CARE_PROVIDER_SITE_OTHER): Payer: Commercial Managed Care - PPO

## 2016-09-23 VITALS — BP 120/80 | Wt 232.0 lb

## 2016-09-23 DIAGNOSIS — Z3A36 36 weeks gestation of pregnancy: Secondary | ICD-10-CM

## 2016-09-23 DIAGNOSIS — O403XX0 Polyhydramnios, third trimester, not applicable or unspecified: Secondary | ICD-10-CM | POA: Diagnosis not present

## 2016-09-23 DIAGNOSIS — O09529 Supervision of elderly multigravida, unspecified trimester: Secondary | ICD-10-CM | POA: Diagnosis not present

## 2016-09-23 NOTE — Progress Notes (Signed)
    Routine Prenatal Care Visit  Subjective  Lindsay Jennings is a 41 y.o. [phone] at [redacted]w[redacted]d being seen today for ongoing prenatal care.  She is currently monitored for the following issues for this high-risk pregnancy and has Breast lump on left side at 2 o'clock position; Encounter for supervision of high risk multigravida of advanced maternal age, antepartum; Low lying placenta nos or without hemorrhage, second trimester; History of LEEP (loop electrosurgical excision procedure) of cervix complicating pregnancy in second trimester; and Polyhydramnios in third trimester on her problem list.  ----------------------------------------------------------------------------------- Patient reports that she is unable to sleep due to discomfort with polyhydramnios..   Contractions: Irregular. Vag. Bleeding: None.  Movement: Present. Denies leaking of fluid.  ----------------------------------------------------------------------------------- The following portions of the patient's history were reviewed and updated as appropriate: allergies, current medications, past family history, past medical history, past social history, past surgical history and problem list. Problem list updated.   Objective  Blood pressure 120/80, weight 232 lb (105.2 kg), last menstrual period 03/17/2016. Pregravid weight 172 lb (78 kg) Total Weight Gain 60 lb (27.2 kg) Urinalysis: Urine Protein: Negative Urine Glucose: Negative  Fetal Status:     Movement: Present     General:  Alert, oriented and cooperative. Patient is in no acute distress.  Skin: Skin is warm and dry. No rash noted.   Cardiovascular: Normal heart rate noted  Respiratory: Normal respiratory effort, no problems with respiration noted  Abdomen: Soft, gravid, appropriate for gestational age. Pain/Pressure: Present     Pelvic:  Cervical exam deferred        Extremities: Normal range of motion.     Mental Status: Normal mood and affect. Normal behavior. Normal  judgment and thought content.     Assessment   41 y.o. [id number] at [redacted]w[redacted]d by  10/18/2016, by Ultrasound presenting for routine prenatal visit  Plan   pregnancy Problems (from 05/19/16 to present)    Problem Noted Resolved   Low lying placenta nos or without hemorrhage, second trimester 05/20/2016 by Dalia Heading, CNM No   Overview Signed 06/30/2016  3:24 PM by Will Bonnet, MD    Resolved         AFI today 31.40 cm GBS collected  Preterm labor symptoms and general obstetric precautions including but not limited to vaginal bleeding, contractions, leaking of fluid and fetal movement were reviewed in detail with the patient.  IOL scheduled 09/27/16 for polyhydramnios.  Avel Sensor, CNM 09/23/2016  3:07 PM

## 2016-09-25 LAB — STREP GP B NAA: Strep Gp B NAA: NEGATIVE

## 2016-09-27 ENCOUNTER — Inpatient Hospital Stay: Payer: Commercial Managed Care - PPO | Admitting: Anesthesiology

## 2016-09-27 ENCOUNTER — Inpatient Hospital Stay
Admission: EM | Admit: 2016-09-27 | Discharge: 2016-10-01 | DRG: 765 | Disposition: A | Discharge status: Home / Self Care | Payer: Commercial Managed Care - PPO | Attending: Obstetrics and Gynecology | Admitting: Obstetrics and Gynecology

## 2016-09-27 DIAGNOSIS — Z3A37 37 weeks gestation of pregnancy: Secondary | ICD-10-CM | POA: Diagnosis not present

## 2016-09-27 DIAGNOSIS — D62 Acute posthemorrhagic anemia: Secondary | ICD-10-CM | POA: Diagnosis present

## 2016-09-27 DIAGNOSIS — O9081 Anemia of the puerperium: Secondary | ICD-10-CM | POA: Diagnosis not present

## 2016-09-27 DIAGNOSIS — O403XX0 Polyhydramnios, third trimester, not applicable or unspecified: Secondary | ICD-10-CM | POA: Diagnosis not present

## 2016-09-27 DIAGNOSIS — O4442 Low lying placenta NOS or without hemorrhage, second trimester: Secondary | ICD-10-CM

## 2016-09-27 LAB — CBC
HCT: 31.3 % — ABNORMAL LOW (ref 35.0–47.0)
Hemoglobin: 10.5 g/dL — ABNORMAL LOW (ref 12.0–16.0)
MCH: 28.8 pg (ref 26.0–34.0)
MCHC: 33.7 g/dL (ref 32.0–36.0)
MCV: 85.5 fL (ref 80.0–100.0)
PLATELETS: 163 10*3/uL (ref 150–440)
RBC: 3.66 MIL/uL — ABNORMAL LOW (ref 3.80–5.20)
RDW: 14 % (ref 11.5–14.5)
WBC: 11.1 10*3/uL — AB (ref 3.6–11.0)

## 2016-09-27 LAB — TYPE AND SCREEN
ABO/RH(D): O POS
Antibody Screen: NEGATIVE

## 2016-09-27 MED ORDER — AMMONIA AROMATIC IN INHA
RESPIRATORY_TRACT | Status: AC
  Filled 2016-09-27: qty 10

## 2016-09-27 MED ORDER — MISOPROSTOL 25 MCG QUARTER TABLET
ORAL_TABLET | ORAL | Status: AC
  Filled 2016-09-27: qty 2

## 2016-09-27 MED ORDER — OXYTOCIN 40 UNITS IN LACTATED RINGERS INFUSION - SIMPLE MED
INTRAVENOUS | Status: AC
  Filled 2016-09-27: qty 1000

## 2016-09-27 MED ORDER — LACTATED RINGERS IV SOLN
INTRAVENOUS | Status: DC
  Administered 2016-09-27 – 2016-09-28 (×4): via INTRAVENOUS

## 2016-09-27 MED ORDER — OXYTOCIN BOLUS FROM INFUSION: 500.0000 mL | Freq: Once | INTRAVENOUS | Status: DC

## 2016-09-27 MED ORDER — LIDOCAINE HCL (PF) 1 % IJ SOLN
INTRAMUSCULAR | Status: AC
  Filled 2016-09-27: qty 30

## 2016-09-27 MED ORDER — LIDOCAINE HCL (PF) 1 % IJ SOLN
INTRAMUSCULAR | Status: DC | PRN
  Administered 2016-09-27: 3 mL via SUBCUTANEOUS

## 2016-09-27 MED ORDER — FENTANYL 2.5 MCG/ML W/ROPIVACAINE 0.15% IN NS 100 ML EPIDURAL (ARMC)
EPIDURAL | Status: AC
  Filled 2016-09-27: qty 100

## 2016-09-27 MED ORDER — TERBUTALINE SULFATE 1 MG/ML IJ SOLN: 0.2500 mg | Freq: Once | INTRAMUSCULAR | Status: DC | PRN

## 2016-09-27 MED ORDER — SOD CITRATE-CITRIC ACID 500-334 MG/5ML PO SOLN
ORAL | Status: AC
  Administered 2016-09-28: 30 mL via ORAL
  Filled 2016-09-27: qty 15

## 2016-09-27 MED ORDER — ONDANSETRON HCL 4 MG/2ML IJ SOLN: 4.0000 mg | Freq: Four times a day (QID) | INTRAMUSCULAR | Status: DC | PRN

## 2016-09-27 MED ORDER — BUPIVACAINE HCL (PF) 0.25 % IJ SOLN
INTRAMUSCULAR | Status: DC | PRN
  Administered 2016-09-27: 4 mL via EPIDURAL
  Administered 2016-09-27: 1 mL via EPIDURAL

## 2016-09-27 MED ORDER — LACTATED RINGERS IV SOLN
500.0000 mL | INTRAVENOUS | Status: DC | PRN
  Administered 2016-09-27: 500 mL via INTRAVENOUS

## 2016-09-27 MED ORDER — OXYTOCIN 40 UNITS IN LACTATED RINGERS INFUSION - SIMPLE MED
2.5000 [IU]/h | INTRAVENOUS | Status: DC
  Administered 2016-09-28: 2.5 [IU]/h via INTRAVENOUS

## 2016-09-27 MED ORDER — FENTANYL 2.5 MCG/ML W/ROPIVACAINE 0.15% IN NS 100 ML EPIDURAL (ARMC)
EPIDURAL | Status: DC | PRN
  Administered 2016-09-27: 12 mL/h via EPIDURAL
  Administered 2016-09-28: 250 ug via EPIDURAL

## 2016-09-27 MED ORDER — ACETAMINOPHEN 325 MG PO TABS
650.0000 mg | ORAL_TABLET | ORAL | Status: DC | PRN
  Administered 2016-09-27: 650 mg via ORAL
  Filled 2016-09-27: qty 2

## 2016-09-27 MED ORDER — OXYTOCIN 40 UNITS IN LACTATED RINGERS INFUSION - SIMPLE MED
1.0000 m[IU]/min | INTRAVENOUS | Status: DC
Start: 2016-09-27 — End: 2016-09-28
  Administered 2016-09-27: 2 m[IU]/min via INTRAVENOUS
  Administered 2016-09-28: 500 mL via INTRAVENOUS

## 2016-09-27 MED ORDER — OXYTOCIN 10 UNIT/ML IJ SOLN
INTRAMUSCULAR | Status: AC
  Filled 2016-09-27: qty 2

## 2016-09-27 MED ORDER — LIDOCAINE-EPINEPHRINE (PF) 1.5 %-1:200000 IJ SOLN
INTRAMUSCULAR | Status: DC | PRN
  Administered 2016-09-27: 3 mL via EPIDURAL

## 2016-09-27 MED ORDER — MISOPROSTOL 200 MCG PO TABS
ORAL_TABLET | ORAL | Status: AC
  Filled 2016-09-27: qty 3

## 2016-09-27 NOTE — Progress Notes (Addendum)
  Labor Progress Note   41 y.o. [id number] @ [redacted]w[redacted]d , admitted for induction of labor due to polyhydramnios  Subjective:  Upright in bed, coping well but uncomfortable during contractions. Desires epidural.  Objective:  BP (!) 127/96   Pulse 72   Temp 98.3 F (36.8 C) (Oral)   Resp 19   Ht 5\' 2"  (1.575 m)   Wt 232 lb (105.2 kg)   LMP 03/17/2016 (Approximate) Comment: unsure dates  BMI 42.43 kg/m  Abd: moderate Extr: trace to 1+ bilateral pedal edema SVE: CERVIX: 4.5 cm dilated, 80% effaced, bulging bag of waters on RN exam MEMBRANES: intact  EFM: FHR: 135 bpm, variability: moderate,  accelerations:  Present,  decelerations:  Absent Toco: Frequency: Every 2-2.62minutes, Duration: 50-80 seconds and Intensity: moderate Labs: I have reviewed the patient's lab results.   Assessment & Plan:  [id number] @ [redacted]w[redacted]d, admitted for induction of labor. Some elevated diastolic pressures, no HA or blurred vision.  1. Pain management: epidural. 2. FWB: FHT category I.  3. ID: GBS negative  4. Labor management: continue to monitor, controlled amniotomy after epidural placement.  All discussed with patient, see orders.  Avel Sensor, CNM 09/27/2016  4:43 PM

## 2016-09-27 NOTE — Anesthesia Preprocedure Evaluation (Addendum)
Anesthesia Evaluation  Patient identified by MRN, date of birth, ID band Patient awake    Reviewed: Allergy & Precautions, H&P , NPO status , Patient's Chart, lab work & pertinent test results, reviewed documented beta blocker date and time   History of Anesthesia Complications Negative for: history of anesthetic complications  Airway Mallampati: III       Dental no notable dental hx. (+) Teeth Intact   Pulmonary           Cardiovascular      Neuro/Psych    GI/Hepatic   Endo/Other    Renal/GU      Musculoskeletal   Abdominal   Peds  Hematology   Anesthesia Other Findings   Reproductive/Obstetrics (+) Pregnancy                            Anesthesia Physical Anesthesia Plan  ASA: II  Anesthesia Plan: Epidural   Post-op Pain Management:    Induction:   PONV Risk Score and Plan:   Airway Management Planned:   Additional Equipment:   Intra-op Plan:   Post-operative Plan:   Informed Consent: I have reviewed the patients History and Physical, chart, labs and discussed the procedure including the risks, benefits and alternatives for the proposed anesthesia with the patient or authorized representative who has indicated his/her understanding and acceptance.     Plan Discussed with: Anesthesiologist  Anesthesia Plan Comments:         Anesthesia Quick Evaluation

## 2016-09-27 NOTE — Progress Notes (Signed)
  Labor Progress Note   41 y.o. [id number] @ [redacted]w[redacted]d , admitted for induction of labor due to polyhydramnios  Subjective:  Resting with epidural. Earlier nausea resolved.  Objective:  BP (!) 94/53 (BP Location: Right Arm)   Pulse 62   Temp 97.9 F (36.6 C) (Oral)   Resp 18   Ht 5\' 2"  (1.575 m)   Wt 232 lb (105.2 kg)   LMP 03/17/2016 (Approximate) Comment: unsure dates  BMI 42.43 kg/m  Abd: moderate Extr: trace to 1+ bilateral pedal edema SVE: CERVIX: 1/soft/posterior MEMBRANES: SROM, leaking slowly  EFM: FHR: 120 bpm, variability: minimal,  accelerations:  Present,  decelerations:  Absent Toco: Frequency: Every 2-4 minutes, Duration: 40-80 seconds and Intensity: moderate Labs: I have reviewed the patient's lab results.   Assessment & Plan:  [id number] @ [redacted]w[redacted]d, admitted for induction of labor.   1. Pain management: epidural. 2. FWB: FHT category II.  3. ID: GBS negative  4. Labor management: continue to monitor, pitocin discontinued following significant deceleration after epidural placement but contracting on her own. All discussed with patient, see orders.  Avel Sensor, CNM 09/27/2016  9:45 PM

## 2016-09-27 NOTE — Anesthesia Procedure Notes (Signed)
Epidural  Start time: 09/27/2016 5:27 PM End time: 09/27/2016 5:52 PM  Staffing Anesthesiologist: Gunnar Fusi Resident/CRNA: Aline Brochure Performed: resident/CRNA   Preanesthetic Checklist Completed: patient identified, site marked, surgical consent, pre-op evaluation, IV checked, risks and benefits discussed and monitors and equipment checked  Epidural Patient position: sitting Prep: Betadine Patient monitoring: heart rate, continuous pulse ox and blood pressure Location: L3-L4 Injection technique: LOR air  Needle:  Needle type: Tuohy  Needle gauge: 17 G Needle length: 9 cm Needle insertion depth: 7.5 cm Catheter type: closed end flexible Catheter size: 19 Gauge Catheter at skin depth: 12 cm Test dose: negative and 1.5% lidocaine with Epi 1:200 K  Assessment Events: blood not aspirated, injection not painful, no injection resistance, negative IV test and no paresthesia  Additional Notes Reason for block:procedure for pain

## 2016-09-27 NOTE — H&P (Signed)
Obstetrics Admission History & Physical   Scheduled Induction (induction of labor )   HPI:  41 y.o. Q4O9629 @ [redacted]w[redacted]d (10/18/2016, by Ultrasound). Admitted on 09/27/2016:   Patient Active Problem List   Diagnosis Date Noted  . Polyhydramnios affecting pregnancy in third trimester 09/27/2016  . Polyhydramnios in third trimester 08/25/2016  . History of LEEP (loop electrosurgical excision procedure) of cervix complicating pregnancy in second trimester 06/01/2016  . Low lying placenta nos or without hemorrhage, second trimester 05/20/2016  . Encounter for supervision of high risk multigravida of advanced maternal age, antepartum 05/19/2016  . Breast lump on left side at 2 o'clock position 04/26/2016     Presents for induction of labor as recommended by [facility] for polyhydramnios. Last AFI=31.40 cm on 09/23/2016.   Prenatal care at: at [facility]. Pregnancy complicated by polyhydramnios..  ROS: A review of systems was performed and negative, except as stated in the above HPI. PMHx:  Past Medical History:  Diagnosis Date  . CIN II (cervical intraepithelial neoplasia II)   . H/O LEEP   . History of abnormal cervical Pap smear   . Left breast mass 04/26/2016   PSHx:  Past Surgical History:  Procedure Laterality Date  . CERVICAL BIOPSY  W/ LOOP ELECTRODE EXCISION  12/05/2012   CONE LEEP   Medications:  Prescriptions Prior to Admission  Medication Sig Dispense Refill Last Dose  . Prenatal Vit-Fe Fumarate-FA (PRENATAL VITAMIN PO) Take by mouth.   Taking   Allergies: has No Known Allergies. OBHx:  OB History  Gravida Para Term Preterm AB Living  4 2 2   1 2   SAB TAB Ectopic Multiple Live Births    1     2    # Outcome Date GA Lbr Len/2nd Weight Sex Delivery Anes PTL Lv  4 Current           3 Term 08/14/98   7 lb (3.175 kg) F Vag-Spont   LIV  2 Term 07/10/95   6 lb 1 oz (2.75 kg) F Vag-Spont   LIV  1 TAB              BMW:[id number] except as detailed in HPI.Marland Kitchen  No  family history of birth defects. Soc Hx: Never smoker, Alcohol: none and Recreational drug use: none  Objective:   Vitals:   09/27/16 1111 09/27/16 1148  BP: (!) 127/93 114/83  Pulse: 97 88  Resp: 19   Temp: 98.3 F (36.8 C)    Constitutional: Well nourished, well developed female in no acute distress.  HEENT: normal Skin: Warm and dry.  Cardiovascular:Regular rate and rhythm.   Extremity: trace to 1+ bilateral pedal edema Respiratory: Clear to auscultation bilateral. Normal respiratory effort Abdomen: non-tender Back: no CVAT Neuro: DTRs 2+, Cranial nerves grossly intact Psych: Alert and Oriented x3. No memory deficits. Normal mood and affect.  MS: normal gait, normal bilateral lower extremity ROM/strength/stability.  Pelvic exam: is not limited by body habitus External Genitalia, Bartholin's glands, Urethra, Skene's glands: within normal limits Vagina: within normal limits and with normal mucosa, no blood in the vault Cervix: 3/60/ballotable Uterus: occasional irregular contractions  Adnexa: not evaluated  EFM:FHR: 140 bpm, variability: moderate,  accelerations:  Present,  decelerations:  Absent Toco: occasional contractions, Category I tracing.  Bedside Ultrasound shows vertex fetus.   Perinatal info:  Blood type: O positive Rubella- Immune Varicella -Immune TDaP Given during third trimester of this pregnancy 7/2 RPR NR / HIV Neg/ HBsAg Neg   Assessment &  Plan:   41 y.o. [id number] @ [redacted]w[redacted]d, Admitted on 09/27/2016 for induction of labor due to polyhydramnios.    Admit for labor, Observe for cervical change, Fetal Wellbeing Reassuring, Epidural when ready and AROM when Appropriate  Avel Sensor, [facility] 09/27/2016  12:13 PM

## 2016-09-28 ENCOUNTER — Encounter
Admission: EM | Disposition: A | Discharge status: Home / Self Care | Payer: Self-pay | Source: Home / Self Care | Attending: Obstetrics and Gynecology

## 2016-09-28 ENCOUNTER — Encounter: Payer: Self-pay | Admitting: Anesthesiology

## 2016-09-28 DIAGNOSIS — O403XX0 Polyhydramnios, third trimester, not applicable or unspecified: Secondary | ICD-10-CM

## 2016-09-28 DIAGNOSIS — Z3A37 37 weeks gestation of pregnancy: Secondary | ICD-10-CM

## 2016-09-28 LAB — RPR: RPR: NONREACTIVE

## 2016-09-28 SURGERY — Surgical Case
Anesthesia: Epidural | Site: Abdomen | Wound class: Clean Contaminated

## 2016-09-28 MED ORDER — KETOROLAC TROMETHAMINE 30 MG/ML IJ SOLN
INTRAMUSCULAR | Status: DC | PRN
  Administered 2016-09-28: 30 mg via INTRAVENOUS

## 2016-09-28 MED ORDER — EPHEDRINE SULFATE 50 MG/ML IJ SOLN
INTRAMUSCULAR | Status: AC
  Filled 2016-09-28: qty 1

## 2016-09-28 MED ORDER — CEFAZOLIN SODIUM-DEXTROSE 2-4 GM/100ML-% IV SOLN
2.0000 g | INTRAVENOUS | Status: AC
  Administered 2016-09-28 (×2): 2 g via INTRAVENOUS
  Filled 2016-09-28: qty 100

## 2016-09-28 MED ORDER — OXYTOCIN 40 UNITS IN LACTATED RINGERS INFUSION - SIMPLE MED
2.5000 [IU]/h | INTRAVENOUS | Status: AC
  Administered 2016-09-28: 2.5 [IU]/h via INTRAVENOUS
  Filled 2016-09-28: qty 1000

## 2016-09-28 MED ORDER — FENTANYL CITRATE (PF) 100 MCG/2ML IJ SOLN
INTRAMUSCULAR | Status: AC
  Filled 2016-09-28: qty 2

## 2016-09-28 MED ORDER — FENTANYL CITRATE (PF) 100 MCG/2ML IJ SOLN: 25.0000 ug | INTRAMUSCULAR | Status: DC | PRN

## 2016-09-28 MED ORDER — SIMETHICONE 80 MG PO CHEW
80.0000 mg | CHEWABLE_TABLET | Freq: Three times a day (TID) | ORAL | Status: DC
  Administered 2016-09-29 – 2016-10-01 (×8): 80 mg via ORAL
  Filled 2016-09-28 (×9): qty 1

## 2016-09-28 MED ORDER — ONDANSETRON HCL 4 MG/2ML IJ SOLN
INTRAMUSCULAR | Status: DC | PRN
  Administered 2016-09-28: 4 mg via INTRAVENOUS

## 2016-09-28 MED ORDER — IBUPROFEN 800 MG PO TABS
800.0000 mg | ORAL_TABLET | Freq: Once | ORAL | Status: AC
Start: 2016-09-28 — End: 2016-09-28
  Administered 2016-09-28: 800 mg via ORAL
  Filled 2016-09-28: qty 1

## 2016-09-28 MED ORDER — DIBUCAINE 1 % RE OINT: 1.0000 "application " | TOPICAL_OINTMENT | RECTAL | Status: DC | PRN

## 2016-09-28 MED ORDER — MENTHOL 3 MG MT LOZG
1.0000 | LOZENGE | OROMUCOSAL | Status: DC | PRN
  Filled 2016-09-28: qty 9

## 2016-09-28 MED ORDER — ONDANSETRON HCL 4 MG/2ML IJ SOLN
INTRAMUSCULAR | Status: AC
  Filled 2016-09-28: qty 2

## 2016-09-28 MED ORDER — SIMETHICONE 80 MG PO CHEW: 80.0000 mg | CHEWABLE_TABLET | ORAL | Status: DC

## 2016-09-28 MED ORDER — BUPIVACAINE HCL (PF) 0.5 % IJ SOLN: 20.0000 mL | Freq: Once | INTRAMUSCULAR | Status: DC

## 2016-09-28 MED ORDER — BUTORPHANOL TARTRATE 1 MG/ML IJ SOLN: 1.0000 mg | INTRAMUSCULAR | Status: DC | PRN

## 2016-09-28 MED ORDER — FENTANYL CITRATE (PF) 100 MCG/2ML IJ SOLN
INTRAMUSCULAR | Status: DC | PRN
Start: 2016-09-28 — End: 2016-09-28
  Administered 2016-09-28: 50 ug via INTRAVENOUS

## 2016-09-28 MED ORDER — MORPHINE SULFATE (PF) 2 MG/ML IV SOLN
2.0000 mg | INTRAVENOUS | Status: DC | PRN
  Administered 2016-09-28 – 2016-09-29 (×6): 2 mg via INTRAVENOUS
  Filled 2016-09-28 (×6): qty 1

## 2016-09-28 MED ORDER — SIMETHICONE 80 MG PO CHEW: 80.0000 mg | CHEWABLE_TABLET | ORAL | Status: DC | PRN

## 2016-09-28 MED ORDER — ONDANSETRON HCL 4 MG/2ML IJ SOLN
4.0000 mg | Freq: Four times a day (QID) | INTRAMUSCULAR | Status: DC | PRN
  Administered 2016-09-28: 4 mg via INTRAVENOUS
  Filled 2016-09-28: qty 2

## 2016-09-28 MED ORDER — BUPIVACAINE ON-Q PAIN PUMP (FOR ORDER SET NO CHG)
INJECTION | Status: DC
  Filled 2016-09-28: qty 1

## 2016-09-28 MED ORDER — OXYCODONE-ACETAMINOPHEN 5-325 MG PO TABS
2.0000 | ORAL_TABLET | ORAL | Status: DC | PRN
  Administered 2016-09-29 – 2016-10-01 (×9): 2 via ORAL
  Filled 2016-09-28 (×11): qty 2

## 2016-09-28 MED ORDER — LACTATED RINGERS IV SOLN: INTRAVENOUS | Status: DC

## 2016-09-28 MED ORDER — SOD CITRATE-CITRIC ACID 500-334 MG/5ML PO SOLN
30.0000 mL | ORAL | Status: AC
  Administered 2016-09-28: 30 mL via ORAL

## 2016-09-28 MED ORDER — BUPIVACAINE 0.25 % ON-Q PUMP DUAL CATH 400 ML
400.0000 mL | INJECTION | Status: DC
  Filled 2016-09-28: qty 400

## 2016-09-28 MED ORDER — KETOROLAC TROMETHAMINE 30 MG/ML IJ SOLN
INTRAMUSCULAR | Status: AC
  Filled 2016-09-28: qty 1

## 2016-09-28 MED ORDER — PHENYLEPHRINE HCL 10 MG/ML IJ SOLN
INTRAMUSCULAR | Status: DC | PRN
  Administered 2016-09-28 (×2): 100 ug via INTRAVENOUS

## 2016-09-28 MED ORDER — ONDANSETRON HCL 4 MG/2ML IJ SOLN: 4.0000 mg | Freq: Once | INTRAMUSCULAR | Status: DC | PRN

## 2016-09-28 MED ORDER — WITCH HAZEL-GLYCERIN EX PADS: 1.0000 "application " | MEDICATED_PAD | CUTANEOUS | Status: DC | PRN

## 2016-09-28 MED ORDER — PROPOFOL 10 MG/ML IV BOLUS
INTRAVENOUS | Status: AC
  Filled 2016-09-28: qty 20

## 2016-09-28 MED ORDER — IBUPROFEN 600 MG PO TABS
600.0000 mg | ORAL_TABLET | Freq: Four times a day (QID) | ORAL | Status: DC
  Administered 2016-09-28 – 2016-10-01 (×11): 600 mg via ORAL
  Filled 2016-09-28 (×12): qty 1

## 2016-09-28 MED ORDER — BUPIVACAINE HCL (PF) 0.5 % IJ SOLN
INTRAMUSCULAR | Status: DC | PRN
  Administered 2016-09-28: 10 mL

## 2016-09-28 MED ORDER — COCONUT OIL OIL: 1.0000 "application " | TOPICAL_OIL | Status: DC | PRN

## 2016-09-28 MED ORDER — SENNOSIDES-DOCUSATE SODIUM 8.6-50 MG PO TABS
2.0000 | ORAL_TABLET | ORAL | Status: DC
  Administered 2016-09-29 – 2016-10-01 (×3): 2 via ORAL
  Filled 2016-09-28 (×3): qty 2

## 2016-09-28 MED ORDER — PRENATAL MULTIVITAMIN CH
1.0000 | ORAL_TABLET | Freq: Every day | ORAL | Status: DC
  Administered 2016-09-29 – 2016-10-01 (×3): 1 via ORAL
  Filled 2016-09-28 (×3): qty 1

## 2016-09-28 MED ORDER — FENTANYL 2.5 MCG/ML W/ROPIVACAINE 0.15% IN NS 100 ML EPIDURAL (ARMC)
EPIDURAL | Status: AC
  Filled 2016-09-28: qty 100

## 2016-09-28 MED ORDER — OXYCODONE-ACETAMINOPHEN 5-325 MG PO TABS
1.0000 | ORAL_TABLET | ORAL | Status: DC | PRN
  Administered 2016-10-01 (×2): 1 via ORAL
  Filled 2016-09-28: qty 1

## 2016-09-28 MED ORDER — BUPIVACAINE HCL (PF) 0.5 % IJ SOLN
INTRAMUSCULAR | Status: AC
  Filled 2016-09-28: qty 30

## 2016-09-28 MED ORDER — LIDOCAINE HCL (PF) 2 % IJ SOLN
INTRAMUSCULAR | Status: DC | PRN
  Administered 2016-09-28 (×2): 5 mg via EPIDURAL

## 2016-09-28 MED ORDER — ACETAMINOPHEN 325 MG PO TABS: 650.0000 mg | ORAL_TABLET | ORAL | Status: DC | PRN

## 2016-09-28 MED ORDER — DIPHENHYDRAMINE HCL 25 MG PO CAPS: 25.0000 mg | ORAL_CAPSULE | Freq: Four times a day (QID) | ORAL | Status: DC | PRN

## 2016-09-28 MED ORDER — LIDOCAINE HCL 2 % IJ SOLN
INTRAMUSCULAR | Status: AC
  Filled 2016-09-28: qty 20

## 2016-09-28 SURGICAL SUPPLY — 31 items
BAG COUNTER SPONGE EZ (MISCELLANEOUS) ×2 IMPLANT
CANISTER SUCT 3000ML PPV (MISCELLANEOUS) ×3 IMPLANT
CATH KIT ON-Q SILVERSOAK 5IN (CATHETERS) ×9 IMPLANT
CHLORAPREP W/TINT 26ML (MISCELLANEOUS) ×6 IMPLANT
CLOSURE WOUND 1/2 X4 (GAUZE/BANDAGES/DRESSINGS) ×1
COUNTER SPONGE BAG EZ (MISCELLANEOUS) ×1
DERMABOND ADVANCED (GAUZE/BANDAGES/DRESSINGS) ×2
DERMABOND ADVANCED .7 DNX12 (GAUZE/BANDAGES/DRESSINGS) ×1 IMPLANT
DRSG OPSITE POSTOP 4X10 (GAUZE/BANDAGES/DRESSINGS) ×3 IMPLANT
DRSG TELFA 3X8 NADH (GAUZE/BANDAGES/DRESSINGS) ×3 IMPLANT
ELECT CAUTERY BLADE 6.4 (BLADE) ×3 IMPLANT
ELECT REM PT RETURN 9FT ADLT (ELECTROSURGICAL) ×3
ELECTRODE REM PT RTRN 9FT ADLT (ELECTROSURGICAL) ×1 IMPLANT
GAUZE SPONGE 4X4 12PLY STRL (GAUZE/BANDAGES/DRESSINGS) ×3 IMPLANT
GLOVE BIO SURGEON STRL SZ7 (GLOVE) ×3 IMPLANT
GLOVE INDICATOR 7.5 STRL GRN (GLOVE) ×3 IMPLANT
GOWN STRL REUS W/ TWL LRG LVL3 (GOWN DISPOSABLE) ×3 IMPLANT
GOWN STRL REUS W/TWL LRG LVL3 (GOWN DISPOSABLE) ×6
NS IRRIG 1000ML POUR BTL (IV SOLUTION) ×3 IMPLANT
PACK C SECTION AR (MISCELLANEOUS) ×3 IMPLANT
PAD OB MATERNITY 4.3X12.25 (PERSONAL CARE ITEMS) ×3 IMPLANT
PAD PREP 24X41 OB/GYN DISP (PERSONAL CARE ITEMS) ×3 IMPLANT
RETRACTOR TRAXI PANNICULUS (MISCELLANEOUS) ×1 IMPLANT
STAPLER INSORB 30 2030 C-SECTI (MISCELLANEOUS) ×3 IMPLANT
STRIP CLOSURE SKIN 1/2X4 (GAUZE/BANDAGES/DRESSINGS) ×2 IMPLANT
SUT MNCRL AB 4-0 PS2 18 (SUTURE) ×3 IMPLANT
SUT PDS AB 1 TP1 96 (SUTURE) ×6 IMPLANT
SUT VIC AB 0 CTX 36 (SUTURE) ×4
SUT VIC AB 0 CTX36XBRD ANBCTRL (SUTURE) ×2 IMPLANT
SUT VIC AB 2-0 CT1 36 (SUTURE) ×3 IMPLANT
TRAXI PANNICULUS RETRACTOR (MISCELLANEOUS) ×2

## 2016-09-28 NOTE — Anesthesia Post-op Follow-up Note (Signed)
Anesthesia QCDR form completed.        

## 2016-09-28 NOTE — Progress Notes (Signed)
   Subjective:    Objective:   Vitals: Blood pressure 105/70, pulse 73, temperature 97.9 F (36.6 C), resp. rate 18, height 5\' 2"  (1.575 m), weight 232 lb (105.2 kg), last menstrual period 03/17/2016, SpO2 100 %. Total weight gain this pregnancy 60 lb (27.2 kg) 1-hr 132  General: NAD Abdomen: gravid, non-tender Cervical Exam:  Dilation: 5 Effacement (%): 80 Cervical Position: Middle Station: -1 Presentation: Vertex Exam by:: Robin Searing RN  FHT: 120, moderate, +accels, intermittent late decelerations (review of tracing shows a run of late deceleration around 0500) Toco: q5-65min  Results for orders placed or performed during the hospital encounter of 09/27/16 (from the past 24 hour(s))  CBC     Status: Abnormal   Collection Time: 09/27/16 11:36 AM  Result Value Ref Range   WBC 11.1 (H) 3.6 - 11.0 K/uL   RBC 3.66 (L) 3.80 - 5.20 MIL/uL   Hemoglobin 10.5 (L) 12.0 - 16.0 g/dL   HCT 31.3 (L) 35.0 - 47.0 %   MCV 85.5 80.0 - 100.0 fL   MCH 28.8 26.0 - 34.0 pg   MCHC 33.7 32.0 - 36.0 g/dL   RDW 14.0 11.5 - 14.5 %   Platelets 163 150 - 440 K/uL  RPR     Status: None   Collection Time: 09/27/16 11:36 AM  Result Value Ref Range   RPR Ser Ql Non Reactive Non Reactive  Type and screen     Status: None   Collection Time: 09/27/16 12:13 PM  Result Value Ref Range   ABO/RH(D) O POS    Antibody Screen NEG    Sample Expiration 09/30/2016     Assessment:   41 y.o. Q3R0076 [redacted]w[redacted]d AMA, polyhydramnios IOL for symptomatic polyhydramnios  Plan:   1) Labor - continue to titrate pitocin.  Discussed fetal heart rate tracing, if continued late deceleration my recommendation would be to expedite delivery via Cesarean section  2) Fetus - category II tracing

## 2016-09-28 NOTE — Progress Notes (Signed)
  Labor Progress Note   41 y.o. O1V6153 @ [redacted]w[redacted]d, admitted for induction of labor due to polyhydramnios.  Subjective:  Sleeping, easily awakened, NAD.  Objective:  BP (!) 101/57   Pulse 65   Temp 97.7 F (36.5 C) (Oral)   Resp 18   Ht 5\' 2"  (1.575 m)   Wt 232 lb (105.2 kg)   LMP 03/17/2016 (Approximate) Comment: unsure dates  SpO2 99%   BMI 42.43 kg/m  Abd: moderate Extr: 1+ bilateral pedal edema SVE: CERVIX: 1.5/soft/posterior Kenton Kingfisher, MD) MEMBRANES: AROM, clear, FSE placed  EFM: FHR: 130 bpm, variability: moderate,  accelerations: Present, decelerations:  Variable Toco: Frequency: Every 1.5-5 minutes, Duration: 30-60 seconds and Intensity: mild Labs: I have reviewed the patient's lab results.   Assessment & Plan:  [id number] @ [redacted]w[redacted]d, admitted for induction of labor.   1. Pain management: epidural. 2. FWB: FHT category II.  3. ID: GBS negative  4. Significantly more fluid came out following AROM, had been slowly leaking previously. 5. Labor management: Contractions have spaced out without Pitocin, monitor baby following FSE placement and restart Pitocin as tolerated. All discussed with patient.  Avel Sensor, CNM 09/28/2016  2:52 AM

## 2016-09-28 NOTE — Transfer of Care (Signed)
Immediate Anesthesia Transfer of Care Note  Patient: Lindsay Jennings  Procedure(s) Performed: Procedure(s): CESAREAN SECTION (N/A)  Patient Location: PACU  Anesthesia Type:General  Level of Consciousness: sedated  Airway & Oxygen Therapy: Patient Spontanous Breathing and Patient connected to face mask oxygen  Post-op Assessment: Report given to RN and Post -op Vital signs reviewed and stable  Post vital signs: Reviewed and stable  Last Vitals:  Vitals:   09/28/16 1315 09/28/16 1544  BP:  138/79  Pulse:    Resp:  14  Temp:  36.9 C  SpO2: 007% 121%    Complications: No apparent anesthesia complications

## 2016-09-28 NOTE — Discharge Summary (Signed)
Obstetric Discharge Summary Reason for Admission: induction of labor and symptomatic polyhydramnios Prenatal Procedures: none Intrapartum Procedures: cesarean: low cervical, transverse Postpartum Procedures: none Complications-Operative and Postpartum: none Hemoglobin  Date Value Ref Range Status  09/29/2016 8.9 (L) 12.0 - 16.0 g/dL Final  09/08/2016 11.1 11.1 - 15.9 g/dL Final   HCT  Date Value Ref Range Status  09/29/2016 26.9 (L) 35.0 - 47.0 % Final   Hematocrit  Date Value Ref Range Status  09/08/2016 33.4 (L) 34.0 - 46.6 % Final    Physical Exam:  General: alert, cooperative and appears stated age 41: appropriate Uterine Fundus: firm at U/2, lochia scant Incision: healing well, no significant drainage, no dehiscence, no significant erythema DVT Evaluation: No evidence of DVT seen on physical exam.  Discharge Diagnoses: Term Pregnancy-delivered  Discharge Information: Date: 10/01/2016 Activity: pelvic rest and no lifting greater than 10lbs for 6 weeks, no driving for 2 weeks Diet: routine Allergies as of 10/01/2016   No Known Allergies     Medication List    STOP taking these medications   PRENATAL VITAMIN PO     TAKE these medications   Ferrous Fumarate 324 (106 Fe) MG Tabs tablet Commonly known as:  HEMOCYTE - 106 mg FE Take 1 tablet (106 mg of iron total) by mouth daily.   ibuprofen 600 MG tablet Commonly known as:  ADVIL,MOTRIN Take 1 tablet (600 mg total) by mouth every 6 (six) hours as needed for mild pain or cramping.   oxyCODONE-acetaminophen 5-325 MG tablet Commonly known as:  PERCOCET/ROXICET Take 1 tablet by mouth every 4 (four) hours as needed.            Discharge Care Instructions        Start     Ordered   10/02/16 0000  Ferrous Fumarate (HEMOCYTE - 106 MG FE) 324 (106 Fe) MG TABS tablet  Daily    Question:  Supervising Provider  Answer:  Malachy Mood   10/01/16 0950   10/01/16 0000  Call MD for:    Comments:  Heavy  vaginal bleeding greater than 1 pad an hour   10/01/16 0950   10/01/16 0000  Call MD for:  temperature >100.4     10/01/16 0950   10/01/16 0000  Call MD for:  persistant nausea and vomiting     10/01/16 0950   10/01/16 0000  Call MD for:  severe uncontrolled pain     10/01/16 0950   10/01/16 0000  Call MD for:  redness, tenderness, or signs of infection (pain, swelling, redness, odor or green/yellow discharge around incision site)     10/01/16 0950   10/01/16 0000  Call MD for:  difficulty breathing, headache or visual disturbances     10/01/16 0950   10/01/16 0000  Call MD for:  hives     10/01/16 0950   10/01/16 0000  Call MD for:  persistant dizziness or light-headedness     10/01/16 0950   10/01/16 0000  Call MD for:  extreme fatigue     10/01/16 0950   10/01/16 0000  Lifting restrictions    Comments:  Weight restriction of 10lbs for 6 weeks.   10/01/16 0950   10/01/16 0000  Driving restriction    Comments:  Avoid driving for at least 2 weeks or while taking prescription narcotics.   10/01/16 0950   10/01/16 0000  Sexual acrtivity    Comments:  No intercourse, tampons, or anything vaginally for 6 weeks   10/01/16 0950  10/01/16 0000  Discharge wound care:    Comments:  You may apply a light dressing for minor discharge from the incision or to keep waistbands of clothing from rubbing.  You may also have been discharge with a clear dressing in which case this will be removed at your postoperative clinic visit.  You may shower, use soap on your incision.  Avoiding baths or soaking the incision in the first 6 weeks following your surgery.Marland Kitchen   10/01/16 0950   10/01/16 0000  Diet general     10/01/16 0950   10/01/16 0000  oxyCODONE-acetaminophen (PERCOCET/ROXICET) 5-325 MG tablet  Every 4 hours PRN    Question:  Supervising Provider  Answer:  Malachy Mood   10/01/16 0950   10/01/16 0000  ibuprofen (ADVIL,MOTRIN) 600 MG tablet  Every 6 hours PRN    Question:  Supervising  Provider  Answer:  Malachy Mood   10/01/16 0950      Condition: stable Discharge to: home Follow-up Fenton, MD Follow up in 1 week(s).   Specialty:  Obstetrics and Gynecology Why:  incision check Contact information: [address] [place] [address] [phone]           Newborn Data: Live born female  Birth Weight: 6 lb 14.1 oz (3120 g) APGAR: 8, 9  Home with mother.  Rexene Agent 10/01/2016, 9:52 AM

## 2016-09-28 NOTE — Op Note (Signed)
Preoperative Diagnosis: 1) 41 y.o. [id number] at [redacted]w[redacted]d 2) Fetal intolerance to labor 3) Symptomatic polyhydramnios 4) Advanced maternal age  Postoperative Diagnosis: 1) 42 y.o. [id number] at [redacted]w[redacted]d 2) Fetal intolerance to labor3) Symptomatic polyhydramnios 4) Advanced maternal age  Operation Performed: Primary low transverse C-section via pfannenstiel skin incision  Indication: Intermittent late decelerations turning into continuous late deceleration with pushing  Anesthesia: Epidural  Primary Surgeon: Malachy Mood, MD  Preoperative Antibiotics: 2g ancef and 500mg  of azithromycin  Estimated Blood Loss: 824mL  IV Fluids: 1435mL  Urine Output:: 267mL  Drains or Tubes: Foley to gravity drainage, ON-Q catheter system  Implants: none  Specimens Removed: none  Complications: none  Intraoperative Findings:  Normal tubes ovaries and uterus.  Delivery resulted in the birth of a liveborn female, APGAR (1 MIN): 8   APGAR (5 MINS): 9, weight pending  Patient Condition: stable  Procedure in Detail:  Patient was taken to the operating room were she was administered regional anesthesia.  She was positioned in the supine position, prepped and draped in the  Usual sterile fashion.  Prior to proceeding with the case a time out was performed and the level of anesthetic was checked and noted to be adequate.  Utilizing the scalpel a pfannenstiel skin incision was made 2cm above the pubic symphysis and carried down sharply to the the level of the rectus fascia.  The fascia was incised in the midline using the scalpel and then extended using mayo scissors.  The superior border of the rectus fascia was grasped with two Kocher clamps and the underlying rectus muscles were dissected of the fascia using blunt dissection.  The median raphae was incised using Mayo scissors.   The inferior border of the rectus fascia was dissected of the rectus muscles in a similar fashion.  The midline was identified, the  peritoneum was entered bluntly and expanded using manual tractions.  The uterus was noted to be in a none rotated position.  Next the bladder blade was placed retracting the bladder caudally.  A bladder flap was created.  A low transverse incision was scored on the lower uterine segment.  The hysterotomy was entered bluntly using the operators finger.  The hysterotomy incision was extended using manual traction.  The operators hand was placed within the hysterotomy position noting the fetus to be within the OA position.  The vertex was grasped, flexed, brought to the incision, and delivered a traumatically using fundal pressure.  The remainder of the body delivered with ease.  The infant was suctioned, cord was clamped and cut before handing off to the awaiting neonatologist.  The placenta was delivered using manual extraction.  The uterus was exteriorized, wiped clean of clots and debris using two moist laps.  The hysterotomy was closed using a two layer closure of 0 Vicryl, with the first being a running locked, the second a vertical imbricating.  The uterus was returned to the abdomen.  The peritoneal gutters were wiped clean of clots and debris using two moist laps.  The hysterotomy incision was re-inspected noted to be hemostatic.  The rectus muscles were inspected noted to be hemostatic.  The superior border of the rectus fascia was grasped with a Kocher clamp.  The ON-Q trocars were then placed 4cm above the superior border of the incision and tunneled subfascially.  The introducers were removed and the catheters were threaded through the sleeves after which the sleeves were removed.  The fascia was closed using a looped #1 PDS in a  running fashion taking 1cm by 1cm bites.  The subcutaneous tissue was irrigated using warm saline, hemostasis achieved using the bovie.  The subcutaneous dead space was greater than 3cm and was closed.  The subcutaneous dead space was obliterated by using a 53-T 0 Chromic in a  running fashion.   The skin was closed using Insorb staples.  Sponge needle and instrument counts were corrects times two.  The patient tolerated the procedure well and was taken to the recovery room in stable condition.

## 2016-09-28 NOTE — Progress Notes (Signed)
  Labor Progress Note   41 y.o. [id number] @ [redacted]w[redacted]d , admitted for  Pregnancy, Labor Management. Polyhydramnios IOL.  Subjective:  No pain fron ctxs since epidural still.  Does have neck pain.  Objective:  BP (!) 87/57   Pulse 72   Temp 98.5 F (36.9 C) (Oral)   Resp 18   Ht 5\' 2"  (1.575 m)   Wt 232 lb (105.2 kg)   LMP 03/17/2016 (Approximate) Comment: unsure dates  SpO2 100%   BMI 42.43 kg/m  Abd: no pain Extr: 2+ bilateral pedal edema SVE: CERVIX: 4-5 cm dilated, 80 effaced, -2 station, clear fluid large amounts w exam, VTX  EFM: FHR: 130 bpm, variability: moderate,  accelerations:  Present,  decelerations:  Absent Toco: Frequency: Every 4-6 minutes Labs: I have reviewed the patient's lab results.   Assessment & Plan:  [id number] @ [redacted]w[redacted]d, admitted for  Pregnancy and Labor/Delivery Management  1. Pain management: epidural. 2. FWB: FHT category 1.  3. ID: GBS negative 4. Labor management: On Pitocin. Exam now with some cervical change, Vtx well applied esp during ctx, may have LEEP effect on cervix as far as scarring feel/delayed dilation, also may be from polyhydramnios.  Since we are having cervical change now will continue to Pitocin and move towards vaginal delivery.  CS discussed as option due to slow progress or cervical scarring if continues not to dilate further.  All discussed with patient, see orders  Barnett Applebaum, MD, [facility], [facility] 09/28/2016  5:12 AM

## 2016-09-28 NOTE — Progress Notes (Signed)
   Subjective:  Comfortbale  Objective:   Vitals: Blood pressure 108/70, pulse 71, temperature 98 F (36.7 C), temperature source Oral, resp. rate 18, height 5\' 2"  (1.575 m), weight 232 lb (105.2 kg), last menstrual period 03/17/2016, SpO2 100 %. General: NAD Abdomen:gravid, non-tender Cervical Exam:  Dilation: 10 Dilation Complete Date: 09/28/16 Dilation Complete Time: 1330 Effacement (%): 100 Cervical Position: Middle Station: 0 Presentation: Vertex Exam by:: Dr. Georgianne Fick  ROA+2 station  FHT: 120, moderate, no accels, recurrent late decelerations with pushing Toco: q54min  Attempt to expedite delivery via forceps, unable to articulate blades of forceps.  Given prior lates, recurrent lates with pushing discussion had that I recommend proceeding with cesarean section for delivery  No results found for this or any previous visit (from the past 24 hour(s)).  Assessment:   41 y.o. [id number] [redacted]w[redacted]d IOL polyhydramnios and fetal intolerance of labor  Plan:   1) Labor - achieved complete dilation but recurrent late decelerations  2) Fetus category II tracing - d/c pitocin - O2 - proceed with stat 1LTCS  The patient was counseled regarding risk and benefits to proceeding with Cesarean section to expedite delivery.  Risk of cesarean section were discussed including risk of bleeding and need for potential intraoperative or postoperative blood transfusion with a rate of approximately 5% quoted for all Cesarean sections, risk of injury to adjacent organs including but not limited to bowl and bladder, the need for additional surgical procedures to address such injuries, and the risk of infection.  The risk of continued attempts at vaginal delivery include but are note limited to worsening fetal or maternal status.  After consideration of options the patient is amenable to proceed with primary cesarean section for delivery.

## 2016-09-28 NOTE — Progress Notes (Signed)
   Subjective:  Comfortable with epidural in place  Objective:   Vitals: Blood pressure 108/70, pulse 71, temperature 98 F (36.7 C), temperature source Oral, resp. rate 18, height 5\' 2"  (1.575 m), weight 232 lb (105.2 kg), last menstrual period 03/17/2016, SpO2 100 %. General: NAD Abdomen: Gravid, non-tender Cervical Exam:  Dilation: 10 Dilation Complete Date: 09/28/16 Dilation Complete Time: 1230 Effacement (%): 100 Cervical Position: Middle Station: 0 Presentation: Vertex Exam by:: [employer] Still has a little bit of cervix right anterior.  Partially reduces but not completely.  Will allow another 30-60 minutes before attempting to push again.  FHT: 120, moderate, no accels, occasional earlies Toco: q3-109min  No results found for this or any previous visit (from the past 24 hour(s)).  Assessment:   40 y.o. [id number] [redacted]w[redacted]d IOL symptomatic polyhydramnios  Plan:   1) Labor - continue pitocin recheck in 30-60min  2) Fetus - cat I tracing

## 2016-09-29 ENCOUNTER — Encounter: Payer: Self-pay | Admitting: Obstetrics and Gynecology

## 2016-09-29 LAB — CBC
HEMATOCRIT: 26.9 % — AB (ref 35.0–47.0)
HEMOGLOBIN: 8.9 g/dL — AB (ref 12.0–16.0)
MCH: 28.6 pg (ref 26.0–34.0)
MCHC: 32.9 g/dL (ref 32.0–36.0)
MCV: 86.9 fL (ref 80.0–100.0)
Platelets: 131 10*3/uL — ABNORMAL LOW (ref 150–440)
RBC: 3.1 MIL/uL — AB (ref 3.80–5.20)
RDW: 13.8 % (ref 11.5–14.5)
WBC: 20.5 10*3/uL — ABNORMAL HIGH (ref 3.6–11.0)

## 2016-09-29 NOTE — Progress Notes (Addendum)
Patient ID: TORRIA FROMER, female   DOB: 02-17-1975, 41 y.o.   MRN: [id number]  Obstetric Postpartum/PostOperative Daily Progress Note Subjective:  41 y.o. [id number] status post primary cesarean delivery.  She is ambulating, is tolerating po, is voiding spontaneously.  Her pain is well controlled on PO pain medications. Her lochia is less than menses.  Denies feeling lightheaded and dizzy, short of breath with ambulation.    Medications SCHEDULED MEDICATIONS  . bupivacaine  20 mL Infiltration Once  . ibuprofen  600 mg Oral Q6H  . prenatal multivitamin  1 tablet Oral Q1200  . senna-docusate  2 tablet Oral Q24H  . simethicone  80 mg Oral TID PC  . simethicone  80 mg Oral Q24H    MEDICATION INFUSIONS  . bupivacaine 0.25 % ON-Q pump DUAL CATH 400 mL    . bupivacaine ON-Q pain pump    . lactated ringers      PRN MEDICATIONS  acetaminophen, butorphanol, coconut oil, witch hazel-glycerin **AND** dibucaine, diphenhydrAMINE, menthol-cetylpyridinium, morphine injection, ondansetron (ZOFRAN) IV, oxyCODONE-acetaminophen, oxyCODONE-acetaminophen, simethicone    Objective:   Vitals:   09/28/16 1925 09/29/16 0015 09/29/16 0420 09/29/16 0812  BP: 130/73 98/63 (!) 93/59 (!) 94/53  Pulse: 70 70 69 74  Resp: 18 20 20 18   Temp: 98.1 F (36.7 C) 98.5 F (36.9 C) 98 F (36.7 C) 98.1 F (36.7 C)  TempSrc: Oral Oral Oral Oral  SpO2: 100% 98% 97% 99%  Weight:      Height:        Current Vital Signs 24h Vital Sign Ranges  T 98.1 F (36.7 C) Temp  Avg: 98.2 F (36.8 C)  Min: 97.5 F (36.4 C)  Max: 98.5 F (36.9 C)  BP (!) 94/53 BP  Min: 93/59  Max: 138/79  HR 74 Pulse  Avg: 64.8  Min: 57  Max: 74  RR 18 Resp  Avg: 16  Min: 10  Max: 20  SaO2 99 % Not Delivered SpO2  Avg: 99.4 %  Min: 97 %  Max: 100 %       24 Hour I/O Current Shift I/O  Time Ins Outs 09/12 0701 - 09/13 0700 In: 944.8 [I.V.:944.8] Out: 1904 [Urine:1200] 09/13 0701 - 09/13 1900 In: 887 [I.V.:887] Out: 290 [Urine:290]   General: NAD Pulmonary: no increased work of breathing Abdomen: non-distended, non-tender, fundus firm at level of umbilicus Inc: Clean/dry/intact Extremities: no edema, no erythema, no tenderness  Labs:   Recent Labs Lab 09/27/16 1136 09/29/16 0437  WBC 11.1* 20.5*  HGB 10.5* 8.9*  HCT 31.3* 26.9*  PLT 163 131*     Assessment:   41 y.o. [id number] postoperative day # 1, s/p primary cesarean section  Plan:  1) Acute blood loss anemia - hemodynamically stable and asymptomatic - po ferrous sulfate  2) O POS / Rubella 1.65 (05/03 1232)/ Varicella Immune  3) TDAP status received 07/18/16  4) formula /Contraception = pill, but will want BTL  5) Disposition: home POD#2-3  Will Bonnet, MD, FACOG 09/29/2016 11:37 AM

## 2016-09-29 NOTE — Anesthesia Postprocedure Evaluation (Signed)
Anesthesia Post Note  Patient: [name]  Procedure(s) Performed: Procedure(s) (LRB): CESAREAN SECTION (N/A)  Patient location during evaluation: Mother Baby Anesthesia Type: Epidural Level of consciousness: awake and alert Pain management: pain level controlled Vital Signs Assessment: post-procedure vital signs reviewed and stable Respiratory status: spontaneous breathing, nonlabored ventilation and respiratory function stable Cardiovascular status: stable Postop Assessment: no headache, no backache and epidural receding Anesthetic complications: no     Last Vitals:  Vitals:   09/29/16 0420 09/29/16 0812  BP: (!) 93/59 (!) 94/53  Pulse: 69 74  Resp: 20 18  Temp: 36.7 C 36.7 C  SpO2: 97% 99%    Last Pain:  Vitals:   09/29/16 0812  TempSrc: Oral  PainSc:                  Estill Batten

## 2016-09-30 MED ORDER — FERROUS FUMARATE 324 (106 FE) MG PO TABS
1.0000 | ORAL_TABLET | Freq: Every day | ORAL | Status: DC
  Administered 2016-09-30 – 2016-10-01 (×2): 106 mg via ORAL
  Filled 2016-09-30 (×2): qty 1

## 2016-09-30 NOTE — Progress Notes (Signed)
POD #2 Primary CS for FITL Subjective:   Tired and sore. Legs swollen. Tolerating regular diet and passing flatus. Voiding with out difficulty. Bottle feeding  Objective:  Blood pressure 110/79, pulse 68, temperature 97.8 F (36.6 C), temperature source Oral, resp. rate 17, height 5\' 2"  (1.575 m), weight 105.2 kg (232 lb), last menstrual period 03/17/2016, SpO2 100 %.  General: WF in NAD Heart: RRR without murmur Pulmonary: no increased work of breathing/ CTAB Abdomen: non-distended, non-tender, bowel sounds active Incision: Dressing C&D&I; ON Q intact Extremities: +1 edema, no erythema, no tenderness  Results for orders placed or performed during the hospital encounter of 09/27/16 (from the past 72 hour(s))  CBC     Status: Abnormal   Collection Time: 09/27/16 11:36 AM  Result Value Ref Range   WBC 11.1 (H) 3.6 - 11.0 K/uL   RBC 3.66 (L) 3.80 - 5.20 MIL/uL   Hemoglobin 10.5 (L) 12.0 - 16.0 g/dL   HCT 31.3 (L) 35.0 - 47.0 %   MCV 85.5 80.0 - 100.0 fL   MCH 28.8 26.0 - 34.0 pg   MCHC 33.7 32.0 - 36.0 g/dL   RDW 14.0 11.5 - 14.5 %   Platelets 163 150 - 440 K/uL  RPR     Status: None   Collection Time: 09/27/16 11:36 AM  Result Value Ref Range   RPR Ser Ql Non Reactive Non Reactive    Comment: (NOTE) Performed At: [facility] [place], [place] [id number] Lindon Romp MD KG:[id number]   Type and screen     Status: None   Collection Time: 09/27/16 12:13 PM  Result Value Ref Range   ABO/RH(D) O POS    Antibody Screen NEG    Sample Expiration 09/30/2016   CBC     Status: Abnormal   Collection Time: 09/29/16  4:37 AM  Result Value Ref Range   WBC 20.5 (H) 3.6 - 11.0 K/uL   RBC 3.10 (L) 3.80 - 5.20 MIL/uL   Hemoglobin 8.9 (L) 12.0 - 16.0 g/dL   HCT 26.9 (L) 35.0 - 47.0 %   MCV 86.9 80.0 - 100.0 fL   MCH 28.6 26.0 - 34.0 pg   MCHC 32.9 32.0 - 36.0 g/dL   RDW 13.8 11.5 - 14.5 %   Platelets 131 (L) 150 - 440 K/uL     Assessment:   41 y.o.  [id number] postoperativeday # 2-stable   Plan:  1) Acute blood loss anemia - hemodynamically stable and asymptomatic - po iron and vitamins  2)O POS/ RI/ VI  3) TDAP status -UTD  4) Bottle/Contraception-pills and interval tubal  5) Disposition-probably POD #3  Dalia Heading, CNM

## 2016-10-01 MED ORDER — OXYCODONE-ACETAMINOPHEN 5-325 MG PO TABS: 1.0000 | ORAL_TABLET | ORAL | 0 refills | Status: DC | PRN

## 2016-10-01 MED ORDER — IBUPROFEN 600 MG PO TABS: 600.0000 mg | ORAL_TABLET | Freq: Four times a day (QID) | ORAL | 1 refills | Status: DC | PRN

## 2016-10-01 MED ORDER — FERROUS FUMARATE 324 (106 FE) MG PO TABS: 1.0000 | ORAL_TABLET | Freq: Every day | ORAL | 1 refills | Status: DC

## 2016-10-01 NOTE — Progress Notes (Signed)
Discharge order received from doctor. On-Q pump removed prior to discharge per patient request. Reviewed discharge instructions and prescriptions with patient and answered all questions. Incision cleaning kit given. Follow up appointment given. Patient verbalized understanding. ID bands checked. Patient discharged home with infant via wheelchair by nursing/auxillary.    Abigail Garner, RN  

## 2016-10-01 NOTE — Discharge Instructions (Signed)
Please call your doctor or return to the ER if you experience any chest pains, shortness of breath, dizziness, visual changes, fever greater than 101, any heavy bleeding (saturating more than 1 pad per hour), large clots, or foul smelling discharge, any worsening abdominal pain and cramping that is not controlled by pain medication, or any signs of postpartum depression. No tampons, enemas, douches, or sexual intercourse for 6 weeks. Also avoid tub baths, hot tubs, or swimming for 6 weeks.   Check your incision daily for any signs of infection such as redness, warmth, swelling, increased pain, pus or foul smelling discharge  Activity: Do not lift over 10 lbs for 6 weeks No driving for 1-2 weeks  Pelvic rest for 6 weeks

## 2016-10-01 NOTE — Progress Notes (Signed)
POD#3 Primary C/S Subjective:  Feeling better today and expressing desire to discharge. Tolerating regular diet, passing flatus, ambulating without dizziness, voiding with no difficulty.   Objective:  Blood pressure 128/82, pulse 77, temperature 98.4 F (36.9 C), temperature source Oral, resp. rate 17, height 5\' 2"  (1.575 m), weight 232 lb (105.2 kg), last menstrual period 03/17/2016, SpO2 100 %, unknown if currently breastfeeding.  General: NAD Pulmonary: no increased work of breathing Abdomen: non-distended, non-tender, fundus firm at U/2 Incision: clean/dry/no signs of dehiscance, OnQ pump in place Extremities: +1 edema, no erythema, no tenderness  Results for orders placed or performed during the hospital encounter of 09/27/16 (from the past 72 hour(s))  CBC     Status: Abnormal   Collection Time: 09/29/16  4:37 AM  Result Value Ref Range   WBC 20.5 (H) 3.6 - 11.0 K/uL   RBC 3.10 (L) 3.80 - 5.20 MIL/uL   Hemoglobin 8.9 (L) 12.0 - 16.0 g/dL   HCT 26.9 (L) 35.0 - 47.0 %   MCV 86.9 80.0 - 100.0 fL   MCH 28.6 26.0 - 34.0 pg   MCHC 32.9 32.0 - 36.0 g/dL   RDW 13.8 11.5 - 14.5 %   Platelets 131 (L) 150 - 440 K/uL     Assessment:   41 y.o. [id number] postoperative day # 3 is stable and ready for discharge.   Plan:  1) Acute blood loss anemia - hemodynamically stable and asymptomatic - po ferrous sulfate  2) Blood Type --/--/O POS (09/11 1213) / Rubella 1.65 (05/03 1232) / Varicella immune  3) TDAP status: received 08/12/16  4) Formula feeding   5) Contraception - pills and plans tubal  5) Disposition - discharge home today.  Avel Sensor, CNM 10/01/2016  9:32 AM

## 2016-10-10 ENCOUNTER — Ambulatory Visit (INDEPENDENT_AMBULATORY_CARE_PROVIDER_SITE_OTHER): Payer: Commercial Managed Care - PPO | Admitting: Obstetrics and Gynecology

## 2016-10-10 ENCOUNTER — Encounter: Payer: Self-pay | Admitting: Obstetrics and Gynecology

## 2016-10-10 VITALS — BP 140/90 | HR 77 | Ht 62.0 in | Wt 216.0 lb

## 2016-10-10 DIAGNOSIS — L0390 Cellulitis, unspecified: Secondary | ICD-10-CM

## 2016-10-10 DIAGNOSIS — Z09 Encounter for follow-up examination after completed treatment for conditions other than malignant neoplasm: Secondary | ICD-10-CM

## 2016-10-10 MED ORDER — SULFAMETHOXAZOLE-TRIMETHOPRIM 800-160 MG PO TABS: 1.0000 | ORAL_TABLET | Freq: Two times a day (BID) | ORAL | 0 refills | Status: AC

## 2016-10-10 NOTE — Progress Notes (Signed)
      Postoperative Follow-up Patient presents post op from primary cesarean section 1weeks ago for fetal intolerance to labor.  Subjective: Patient reports some improvement in her preop symptoms. Eating a regular diet without difficulty. The patient is not having any pain.  Activity: normal activities of daily living.  Has noted some discharge from incision, no fevers, no chills.  Objective: Vitals:   10/10/16 0904  BP: 140/90  Pulse: 77   Gen: NAD Pulmonary: no increased work of breathing Abdomen: soft, non-tender, non-distended, incision intact insorb line, surrounding erythema and induration, no fluctuance, no purulent discharge able to be expressed.  Assessment: 41 y.o. s/p LTCS wound cellulitis  Plan: Patient has done well after surgery with no apparent complications.  I have discussed the post-operative course to date, and the expected progress moving forward.  The patient understands what complications to be concerned about.  I will see the patient in routine follow up, or sooner if needed.    Activity plan: No heavy lifting. Will start on bactrim DS not breast feeding or pumping for early cellulitis, recheck incision in 1 week  Malachy Mood 10/10/2016, 7:04 PM

## 2016-10-17 ENCOUNTER — Encounter: Payer: Self-pay | Admitting: Obstetrics and Gynecology

## 2016-10-17 ENCOUNTER — Ambulatory Visit (INDEPENDENT_AMBULATORY_CARE_PROVIDER_SITE_OTHER): Payer: Commercial Managed Care - PPO | Admitting: Obstetrics and Gynecology

## 2016-10-17 VITALS — BP 138/88 | HR 84 | Wt 202.0 lb

## 2016-10-17 DIAGNOSIS — Z4889 Encounter for other specified surgical aftercare: Secondary | ICD-10-CM

## 2016-10-17 DIAGNOSIS — L0390 Cellulitis, unspecified: Secondary | ICD-10-CM

## 2016-10-18 NOTE — Progress Notes (Signed)
      Postoperative Follow-up Patient presents post op from cesarean section 1weeks ago.  Subjective: Patient reports some improvement in her preop symptoms. Eating a regular diet without difficulty. The patient is not having any pain.  Activity: normal activities of daily living.  Objective: Vitals:   10/17/16 1551  BP: 138/88  Pulse: 84   Gen: NAD Pulm: no increased work of breathing Abdomen: soft, non-tender, non-distended.  Resolution of previously noted erythema, induration significantly improved.  Small defect noted at central portion of incision, probed less than 22mm in depth to small to pack, silver nitrate applied.  Assessment: 41 y.o. s/p Cesarean section, wound infection  Plan: Patient has done well after surgery with no apparent complications.  I have discussed the post-operative course to date, and the expected progress moving forward.  The patient understands what complications to be concerned about.  I will see the patient in routine follow up, or sooner if needed.   - significant improvement in induration and resolution of erythema - finish out antibiotic course follow up in 1 week  Activity plan: No heavy lifting.  Malachy Mood 10/18/2016, 10:49 PM

## 2016-10-25 ENCOUNTER — Ambulatory Visit: Payer: Commercial Managed Care - PPO | Attending: Obstetrics and Gynecology

## 2016-10-25 ENCOUNTER — Other Ambulatory Visit: Payer: Commercial Managed Care - PPO

## 2016-10-26 ENCOUNTER — Encounter: Payer: Self-pay | Admitting: Obstetrics and Gynecology

## 2016-10-26 ENCOUNTER — Ambulatory Visit (INDEPENDENT_AMBULATORY_CARE_PROVIDER_SITE_OTHER): Payer: Commercial Managed Care - PPO | Admitting: Obstetrics and Gynecology

## 2016-10-26 VITALS — BP 124/80 | HR 77 | Wt 194.0 lb

## 2016-10-26 DIAGNOSIS — Z4889 Encounter for other specified surgical aftercare: Secondary | ICD-10-CM

## 2016-10-28 NOTE — Progress Notes (Signed)
      Postoperative Follow-up Patient presents post op from Cesarean section on 09/28/2016 for fetal intolerance.  Subjective: Patient reports marked improvement in her preop symptoms. Eating a regular diet without difficulty. Pain is controlled without any medications.  Activity: normal activities of daily living.  Objective: Vitals:   10/26/16 1355  BP: 124/80  Pulse: 77   Gen: NAD Abdomen: soft, non-tender, previously noted induration completely resolved.  Two insorb staples visible above the skin and removed, central portion of incision with a 1.5cm of granulation tissue Ext: no edema  Assessment: 41 y.o. s/p Cesarean section for fetal intolerance with postoperative wound infection  progressing well  Plan: Patient has done well after surgery with no apparent complications.  I have discussed the post-operative course to date, and the expected progress moving forward.  The patient understands what complications to be concerned about.  I will see the patient in routine follow up, or sooner if needed.    Activity plan: No heavy lifting.  Malachy Mood 10/28/2016, 9:37 PM

## 2016-10-31 ENCOUNTER — Telehealth: Payer: Self-pay

## 2016-10-31 NOTE — Telephone Encounter (Signed)
Almyra Free from [facility] calling for confirmation of 1st day of 1st visit, del 09/28/16 via c/s, last day 9/4 d/t complication or did pt take self out.  [phone]  Left detailed msg first visit 05/19/16, del via c/s 09/28/16, read JS's note of 8/28 as well as gave pt's complication list from  22ES visit.

## 2016-11-16 ENCOUNTER — Encounter: Payer: Self-pay | Admitting: Obstetrics and Gynecology

## 2016-11-16 ENCOUNTER — Ambulatory Visit (INDEPENDENT_AMBULATORY_CARE_PROVIDER_SITE_OTHER): Payer: Commercial Managed Care - PPO | Admitting: Obstetrics and Gynecology

## 2016-11-16 VITALS — BP 130/84 | HR 79 | Wt 197.0 lb

## 2016-11-16 DIAGNOSIS — Z124 Encounter for screening for malignant neoplasm of cervix: Secondary | ICD-10-CM

## 2016-11-16 DIAGNOSIS — Z3009 Encounter for other general counseling and advice on contraception: Secondary | ICD-10-CM

## 2016-11-16 MED ORDER — NORETHINDRONE ACETATE 5 MG PO TABS: 2.5000 mg | ORAL_TABLET | Freq: Every day | ORAL | 11 refills | Status: DC

## 2016-11-16 NOTE — Progress Notes (Signed)
Postpartum Visit  Chief Complaint:  Chief Complaint  Patient presents with  . Postpartum Care    delivered 9/12    History of Present Illness: Patient is a 41 y.o. [id number] presents for postpartum visit.   Review the Delivery Report for details.  Date of delivery: 09/28/2016 Cesarean Section: Fetal intolerance to labor Pregnancy or labor problems:  Yes AMA and polyhydramnios Any problems since the delivery:  no  Newborn Details:  SINGLETON :  1. Baby's gender: female Maternal Details:  Breast Feeding:  no Post partum depression/anxiety noted:  no Date of last PAP: 10/08/2015 NIL, HPV negative  Review of Systems: Review of Systems  Constitutional: Negative for chills and fever.  HENT: Negative for congestion.   Respiratory: Negative for cough and shortness of breath.   Cardiovascular: Negative for chest pain and palpitations.  Gastrointestinal: Negative for abdominal pain, constipation, diarrhea, heartburn, nausea and vomiting.  Genitourinary: Negative for dysuria, frequency and urgency.  Skin: Negative for itching and rash.  Neurological: Negative for dizziness and headaches.  Endo/Heme/Allergies: Negative for polydipsia.  Psychiatric/Behavioral: Negative for depression.    The following portions of the patient's history were reviewed and updated as appropriate: allergies, current medications, past family history, past medical history, past social history, past surgical history and problem list.  Past Medical History:  Past Medical History:  Diagnosis Date  . CIN II (cervical intraepithelial neoplasia II)   . H/O LEEP   . History of abnormal cervical Pap smear   . Left breast mass 04/26/2016    Past Surgical History:  Past Surgical History:  Procedure Laterality Date  . CERVICAL BIOPSY  W/ LOOP ELECTRODE EXCISION  12/05/2012   CONE LEEP  . CESAREAN SECTION N/A 09/28/2016   Procedure: CESAREAN SECTION;  Surgeon: Malachy Mood, MD;  Location: [facility] ORS;   Service: Obstetrics;  Laterality: N/A;    Family History:  Family History  Problem Relation Age of Onset  . Breast cancer Maternal Grandmother 71       cancer of the gall bladder  . Colon cancer Mother 31  . Lung cancer Maternal Uncle     Social History:  Social History   Social History  . Marital status: Married    Spouse name: N/A  . Number of children: 2  . Years of education: N/A   Occupational History  . Not on file.   Social History Main Topics  . Smoking status: Never Smoker  . Smokeless tobacco: Never Used  . Alcohol use No  . Drug use: No  . Sexual activity: Yes    Birth control/ protection: None, Condom   Other Topics Concern  . Not on file   Social History Narrative  . No narrative on file    Allergies:  No Known Allergies  Medications: Prior to Admission medications   Medication Sig Start Date End Date Taking? Authorizing Provider  Ferrous Fumarate (HEMOCYTE - 106 MG FE) 324 (106 Fe) MG TABS tablet Take 1 tablet (106 mg of iron total) by mouth daily. 10/02/16   Rexene Agent, CNM  ibuprofen (ADVIL,MOTRIN) 600 MG tablet Take 1 tablet (600 mg total) by mouth every 6 (six) hours as needed for mild pain or cramping. 10/01/16   Rexene Agent, CNM  oxyCODONE-acetaminophen (PERCOCET/ROXICET) 5-325 MG tablet Take 1 tablet by mouth every 4 (four) hours as needed. 10/01/16   Rexene Agent, CNM    Physical Exam Vitals:  Vitals:   11/16/16 1450  BP: 130/84  Pulse: 79    General: NAD HEENT: normocephalic, anicteric Pulmonary: No increased work of breathing Abdomen: NABS, soft, non-tender, non-distended.  Umbilicus without lesions.  No hepatomegaly, splenomegaly or masses palpable. No evidence of hernia. Incision D/C/I well healed Genitourinary:  External: Normal external female genitalia.  Normal urethral meatus, normal  Bartholin's and Skene's glands.    Vagina: Normal vaginal mucosa, no evidence of prolapse.    Cervix: Grossly normal in  appearance, no bleeding  Uterus: Non-enlarged, mobile, normal contour.  No CMT  Adnexa: ovaries non-enlarged, no adnexal masses  Rectal: deferred Extremities: no edema, erythema, or tenderness Neurologic: Grossly intact Psychiatric: mood appropriate, affect full  Assessment: 41 y.o. [id number] presenting for 6 week postpartum visit  Plan: Problem List Items Addressed This Visit    None    Visit Diagnoses    General counseling and advice on female contraception    -  Primary   Encounter for postpartum visit       Relevant Orders   PapIG, HPV, rfx 16/18   Cervical cancer screening       Relevant Orders   PapIG, HPV, rfx 16/18       1) Contraception Education given regarding options for contraception, progestin only pill for now (not breast feeding so 2.5mg ) and interval tubal eventually  2)  Pap - ASCCP guidelines and rational discussed.  Patient opts for yearly screening interval  3) Patient underwent screening for postpartum depression with no concerns noted.  4) Follow up 1 year for routine annual exam

## 2016-11-24 LAB — PAPIG, HPV, RFX 16/18
HPV, high-risk: NEGATIVE
PAP SMEAR COMMENT: 0

## 2016-11-25 ENCOUNTER — Encounter: Payer: Self-pay | Admitting: Obstetrics and Gynecology

## 2017-02-02 ENCOUNTER — Encounter: Payer: Self-pay | Admitting: Obstetrics & Gynecology

## 2017-02-02 ENCOUNTER — Ambulatory Visit
Admission: EM | Admit: 2017-02-02 | Discharge: 2017-02-02 | Disposition: A | Discharge status: Home / Self Care | Payer: Commercial Managed Care - PPO | Attending: Family Medicine | Admitting: Family Medicine

## 2017-02-02 ENCOUNTER — Ambulatory Visit (INDEPENDENT_AMBULATORY_CARE_PROVIDER_SITE_OTHER): Payer: Commercial Managed Care - PPO | Admitting: Obstetrics & Gynecology

## 2017-02-02 ENCOUNTER — Other Ambulatory Visit: Payer: Self-pay

## 2017-02-02 VITALS — BP 130/90 | HR 94 | Ht 62.0 in | Wt 193.0 lb

## 2017-02-02 DIAGNOSIS — N764 Abscess of vulva: Secondary | ICD-10-CM | POA: Diagnosis not present

## 2017-02-02 MED ORDER — CEFTRIAXONE SODIUM 1 G IJ SOLR
1.0000 g | Freq: Once | INTRAMUSCULAR | Status: AC
  Administered 2017-02-02: 1 g via INTRAMUSCULAR

## 2017-02-02 MED ORDER — DOXYCYCLINE HYCLATE 100 MG PO CAPS: 100.0000 mg | ORAL_CAPSULE | Freq: Two times a day (BID) | ORAL | 0 refills | Status: DC

## 2017-02-02 NOTE — Discharge Instructions (Signed)
Antibiotic as prescribed.  Call [facility] ASAP.  Take care  Dr. Lacinda Axon

## 2017-02-02 NOTE — ED Provider Notes (Signed)
[facility]    CSN: [id number] Arrival date & time: 02/02/17  3785  History   Chief Complaint Chief Complaint  Patient presents with  . Abscess   HPI  42 year old female presents with abscess.  Patient noted a bump/small boil on her left labia Sunday.  She states that this has subsequently worsened.  It is now spread per her report.  Pain is severe.  He states that it has drained a small amount of pus.  No reports of fever.  She does endorse chills.  No known exacerbating or relieving factors.  No other reported symptoms.  No other complaints or concerns at this time.  Past Medical History:  Diagnosis Date  . CIN II (cervical intraepithelial neoplasia II)   . H/O LEEP   . History of abnormal cervical Pap smear   . Left breast mass 04/26/2016   Patient Active Problem List   Diagnosis Date Noted  . Postpartum care following cesarean delivery 10/01/2016  . History of LEEP (loop electrosurgical excision procedure) of cervix complicating pregnancy in second trimester 06/01/2016  . Breast lump on left side at 2 o'clock position 04/26/2016   Past Surgical History:  Procedure Laterality Date  . CERVICAL BIOPSY  W/ LOOP ELECTRODE EXCISION  12/05/2012   CONE LEEP  . CESAREAN SECTION N/A 09/28/2016   Procedure: CESAREAN SECTION;  Surgeon: Malachy Mood, MD;  Location: [facility] ORS;  Service: Obstetrics;  Laterality: N/A;   OB History    Gravida Para Term Preterm AB Living   4 3 3   1 3    SAB TAB Ectopic Multiple Live Births     1   0 3     Home Medications    Prior to Admission medications   Medication Sig Start Date End Date Taking? Authorizing Provider  doxycycline (VIBRAMYCIN) 100 MG capsule Take 1 capsule (100 mg total) by mouth 2 (two) times daily. 02/02/17   Coral Spikes, DO  norethindrone (AYGESTIN) 5 MG tablet Take 0.5 tablets (2.5 mg total) by mouth daily. 11/16/16   Malachy Mood, MD   Family History Family History  Problem Relation Age of Onset  .  Breast cancer Maternal Grandmother 71       cancer of the gall bladder  . Colon cancer Mother 53  . Lung cancer Maternal Uncle     Social History Social History   Tobacco Use  . Smoking status: Never Smoker  . Smokeless tobacco: Never Used  Substance Use Topics  . Alcohol use: No  . Drug use: No     Allergies   Patient has no known allergies.   Review of Systems Review of Systems  Constitutional: Positive for chills. Negative for fever.  Skin:       Abscess. Some drainage.   Physical Exam Triage Vital Signs ED Triage Vitals  Enc Vitals Group     BP 02/02/17 0929 139/82     Pulse Rate 02/02/17 0929 94     Resp 02/02/17 0929 16     Temp 02/02/17 0929 98.1 F (36.7 C)     Temp Source 02/02/17 0929 Oral     SpO2 02/02/17 0929 99 %     Weight 02/02/17 0926 192 lb 10.9 oz (87.4 kg)     Height 02/02/17 0926 5\' 2"  (1.575 m)     Head Circumference --      Peak Flow --      Pain Score 02/02/17 0927 9     Pain Loc --  Pain Edu? --      Excl. in [place]? --    Updated Vital Signs BP 139/82 (BP Location: Left Arm)   Pulse 94   Temp 98.1 F (36.7 C) (Oral)   Resp 16   Ht 5\' 2"  (1.575 m)   Wt 192 lb 10.9 oz (87.4 kg)   LMP 01/29/2017   SpO2 99%   BMI 35.24 kg/m    Physical Exam  Constitutional: She is oriented to person, place, and time. She appears well-developed. No distress.  HENT:  Head: Normocephalic and atraumatic.  Pulmonary/Chest: Effort normal. No respiratory distress.  Genitourinary:  Genitourinary Comments: GU: Left labia with exquisite tenderness, and severe induration that encompasses essentially the entire labia.  Erythema.  No drainage currently.  Neurological: She is alert and oriented to person, place, and time.  Psychiatric: She has a normal mood and affect. Her behavior is normal.  Nursing note and vitals reviewed.  UC Treatments / Results  Labs (all labs ordered are listed, but only abnormal results are displayed) Labs Reviewed - No data  to display  EKG  EKG Interpretation None       Radiology No results found.  Procedures Procedures (including critical care time)  Medications Ordered in UC Medications  cefTRIAXone (ROCEPHIN) injection 1 g (1 g Intramuscular Given 02/02/17 0959)     Initial Impression / Assessment and Plan / UC Course  I have reviewed the triage vital signs and the nursing notes.  Pertinent labs & imaging results that were available during my care of the patient were reviewed by me and considered in my medical decision making (see chart for details).     41 year old female presents with a large abscess of the left labia.  Given the location, and extent of her abscess, I have recommended that she see GYN urgently.  Her husband is on the phone with him currently.  IM Rocephin given.  I am placing her on doxycycline.  Patient is in agreement with the plan.  She will be seeing her GYN later today.  Final Clinical Impressions(s) / UC Diagnoses   Final diagnoses:  Abscess of left genital labia    ED Discharge Orders        Ordered    doxycycline (VIBRAMYCIN) 100 MG capsule  2 times daily     02/02/17 0957     Controlled Substance Prescriptions [place] Controlled Substance Registry consulted? Not Applicable   Coral Spikes, DO 02/02/17 1016

## 2017-02-02 NOTE — Progress Notes (Signed)
  HPI:      [name] is a 42 y.o. [phone], Patient's last menstrual period was 01/29/2017., presents today for a problem visit.  She complains of:  Vulvar concern:   This is a 42 y.o. old Caucasian/White female who presents for the evaluation of vulvar pain and swelling, begain 4 days ago but worse today despite freq baths and rest.   She admits to symptoms of pain.  The following aggravating factors are identified: none. The following alleviating factors are identified: abstaining from intercourse.  Denies fever chills nausea.  She has had no previous treatment for this condition until today; Seen in urgent care this am w Rocephin given, Rx Doxycycline.  PMHx: She  has a past medical history of CIN II (cervical intraepithelial neoplasia II), H/O LEEP, History of abnormal cervical Pap smear, and Left breast mass (04/26/2016). Also,  has a past surgical history that includes Cervical biopsy w/ loop electrode excision (12/05/2012) and Cesarean section (N/A, 09/28/2016)., family history includes Breast cancer (age of onset: 59) in her maternal grandmother; Colon cancer (age of onset: 1) in her mother; Lung cancer in her maternal uncle.,  reports that  has never smoked. she has never used smokeless tobacco. She reports that she does not drink alcohol or use drugs.  She has a current medication list which includes the following prescription(s): doxycycline and norethindrone. Also, has No Known Allergies.  Review of Systems  Constitutional: Negative for chills, fever and malaise/fatigue.  HENT: Negative for congestion, sinus pain and sore throat.   Eyes: Negative for blurred vision and pain.  Respiratory: Negative for cough and wheezing.   Cardiovascular: Negative for chest pain and leg swelling.  Gastrointestinal: Negative for abdominal pain, constipation, diarrhea, heartburn, nausea and vomiting.  Genitourinary: Negative for dysuria, frequency, hematuria and urgency.  Musculoskeletal:  Negative for back pain, joint pain, myalgias and neck pain.  Skin: Negative for itching and rash.  Neurological: Negative for dizziness, tremors and weakness.  Endo/Heme/Allergies: Does not bruise/bleed easily.  Psychiatric/Behavioral: Negative for depression. The patient is not nervous/anxious and does not have insomnia.     Objective: BP 130/90   Pulse 94   Ht 5\' 2"  (1.575 m)   Wt 193 lb (87.5 kg)   LMP 01/29/2017   BMI 35.30 kg/m  Physical Exam  Constitutional: She is oriented to person, place, and time. She appears well-developed and well-nourished. No distress.  Genitourinary: Vagina normal. Pelvic exam was performed with patient supine. There is no rash, tenderness or lesion on the right labia.  There is tenderness on the left labia. There is no rash or lesion on the left labia. No erythema or bleeding in the vagina.  Genitourinary Comments: Left Labial swelling and T, min erythema, is external to introitus and not Bartholins  Abdominal: Soft. She exhibits no distension. There is no tenderness.  Musculoskeletal: Normal range of motion.  Neurological: She is alert and oriented to person, place, and time. No cranial nerve deficit.  Skin: Skin is warm and dry.  Psychiatric: She has a normal mood and affect.   ASSESSMENT/PLAN:    Problem List Items Addressed This Visit      Genitourinary   Left genital labial abscess - Primary    Doxycycline, Sitz Baths advised. Avoid lancing unless wrosening Seems to be starting to drain on exam F/u next week  Lindsay Applebaum, MD, [place], [facility] 02/02/2017  11:30 AM

## 2017-02-02 NOTE — ED Triage Notes (Signed)
Pt reports she started with a "boil" on left upper leg and "spread into my privates."  Pain 9/10. "leaking a little pus."

## 2017-02-05 ENCOUNTER — Telehealth: Payer: Self-pay

## 2017-02-05 NOTE — Telephone Encounter (Signed)
Called to follow up with patient since visit here at [facility]. Patient instructed to call back with any questions or concerns. MAH  

## 2017-02-09 ENCOUNTER — Ambulatory Visit: Payer: Commercial Managed Care - PPO | Admitting: Obstetrics & Gynecology

## 2017-02-27 ENCOUNTER — Other Ambulatory Visit: Payer: Self-pay

## 2017-02-27 ENCOUNTER — Ambulatory Visit
Admission: EM | Admit: 2017-02-27 | Discharge: 2017-02-27 | Disposition: A | Discharge status: Home / Self Care | Payer: Commercial Managed Care - PPO | Attending: Family Medicine | Admitting: Family Medicine

## 2017-02-27 ENCOUNTER — Encounter: Payer: Self-pay | Admitting: Emergency Medicine

## 2017-02-27 DIAGNOSIS — R059 Cough, unspecified: Secondary | ICD-10-CM

## 2017-02-27 DIAGNOSIS — R05 Cough: Secondary | ICD-10-CM

## 2017-02-27 DIAGNOSIS — J111 Influenza due to unidentified influenza virus with other respiratory manifestations: Secondary | ICD-10-CM | POA: Diagnosis not present

## 2017-02-27 MED ORDER — BENZONATATE 100 MG PO CAPS: 100.0000 mg | ORAL_CAPSULE | Freq: Three times a day (TID) | ORAL | 0 refills | Status: DC | PRN

## 2017-02-27 MED ORDER — OSELTAMIVIR PHOSPHATE 75 MG PO CAPS: 75.0000 mg | ORAL_CAPSULE | Freq: Two times a day (BID) | ORAL | 0 refills | Status: AC

## 2017-02-27 MED ORDER — ALBUTEROL SULFATE HFA 108 (90 BASE) MCG/ACT IN AERS: 2.0000 | INHALATION_SPRAY | Freq: Four times a day (QID) | RESPIRATORY_TRACT | 0 refills | Status: DC | PRN

## 2017-02-27 NOTE — ED Triage Notes (Signed)
Patient c/o cough and chest congestion for 3 days.

## 2017-02-27 NOTE — ED Provider Notes (Signed)
[facility]    CSN: [id number] Arrival date & time: 02/27/17  1307     History   Chief Complaint Chief Complaint  Patient presents with  . Cough    APPOINTMENT    HPI MERTICE UFFELMAN is a 42 y.o. female.   42 year old female presents with cough, congestion, fever of 101, headaches, chills, body aches for the past 3 days. Also having slight sinus pressure but denies any GI symptoms. Has taken Mucinex and Advil with minimal relief. Was on Doxycycline for an abscess about 3 weeks ago that has completed healed. No history of asthma. Does have a 60 month old boy at home and concerned about exposing him to her illness. Otherwise no chronic health issues. Takes no daily medication.    The history is provided by the patient.    Past Medical History:  Diagnosis Date  . CIN II (cervical intraepithelial neoplasia II)   . H/O LEEP   . History of abnormal cervical Pap smear   . Left breast mass 04/26/2016    Patient Active Problem List   Diagnosis Date Noted  . Left genital labial abscess 02/02/2017  . Postpartum care following cesarean delivery 10/01/2016  . History of LEEP (loop electrosurgical excision procedure) of cervix complicating pregnancy in second trimester 06/01/2016  . Breast lump on left side at 2 o'clock position 04/26/2016    Past Surgical History:  Procedure Laterality Date  . CERVICAL BIOPSY  W/ LOOP ELECTRODE EXCISION  12/05/2012   CONE LEEP  . CESAREAN SECTION N/A 09/28/2016   Procedure: CESAREAN SECTION;  Surgeon: Malachy Mood, MD;  Location: [facility] ORS;  Service: Obstetrics;  Laterality: N/A;    OB History    Gravida Para Term Preterm AB Living   4 3 3   1 3    SAB TAB Ectopic Multiple Live Births     1   0 3       Home Medications    Prior to Admission medications   Medication Sig Start Date End Date Taking? Authorizing Provider  albuterol (PROVENTIL HFA;VENTOLIN HFA) 108 (90 Base) MCG/ACT inhaler Inhale 2 puffs into the lungs every 6  (six) hours as needed for wheezing or shortness of breath. 02/27/17   Katy Apo, NP  benzonatate (TESSALON) 100 MG capsule Take 1 capsule (100 mg total) by mouth 3 (three) times daily as needed for cough. 02/27/17   Katy Apo, NP  oseltamivir (TAMIFLU) 75 MG capsule Take 1 capsule (75 mg total) by mouth 2 (two) times daily for 5 days. 02/27/17 03/04/17  Katy Apo, NP    Family History Family History  Problem Relation Age of Onset  . Breast cancer Maternal Grandmother 71       cancer of the gall bladder  . Colon cancer Mother 13  . Lung cancer Maternal Uncle     Social History Social History   Tobacco Use  . Smoking status: Never Smoker  . Smokeless tobacco: Never Used  Substance Use Topics  . Alcohol use: No  . Drug use: No     Allergies   Patient has no known allergies.   Review of Systems Review of Systems  Constitutional: Positive for chills, fatigue and fever. Negative for activity change and appetite change.  HENT: Positive for congestion, postnasal drip, rhinorrhea, sinus pressure and sore throat. Negative for ear discharge, ear pain, facial swelling, mouth sores and sinus pain.   Eyes: Negative for pain, discharge, redness and itching.  Respiratory: Positive for cough and chest tightness. Negative for shortness of breath and wheezing.   Gastrointestinal: Negative for abdominal pain, diarrhea, nausea and vomiting.  Musculoskeletal: Positive for arthralgias and myalgias. Negative for neck pain and neck stiffness.  Skin: Negative for rash and wound.  Neurological: Positive for headaches. Negative for dizziness, tremors, seizures, syncope, weakness, light-headedness and numbness.  Hematological: Negative for adenopathy. Does not bruise/bleed easily.     Physical Exam Triage Vital Signs ED Triage Vitals  Enc Vitals Group     BP 02/27/17 1324 (!) 131/95     Pulse Rate 02/27/17 1324 98     Resp 02/27/17 1324 16     Temp 02/27/17 1324 (!) 100.7 F  (38.2 C)     Temp Source 02/27/17 1324 Oral     SpO2 02/27/17 1324 99 %     Weight 02/27/17 1322 189 lb (85.7 kg)     Height 02/27/17 1322 5\' 2"  (1.575 m)     Head Circumference --      Peak Flow --      Pain Score 02/27/17 1321 6     Pain Loc --      Pain Edu? --      Excl. in [place]? --    No data found.  Updated Vital Signs BP (!) 131/95 (BP Location: Left Arm)   Pulse 98   Temp (!) 100.7 F (38.2 C) (Oral) Comment: Patient states that she took some Ibuprofen at 11am this morning  Resp 16   Ht 5\' 2"  (1.575 m)   Wt 189 lb (85.7 kg)   LMP 01/28/2017 (Exact Date)   SpO2 99%   Breastfeeding? No   BMI 34.57 kg/m   Visual Acuity Right Eye Distance:   Left Eye Distance:   Bilateral Distance:    Right Eye Near:   Left Eye Near:    Bilateral Near:     Physical Exam  Constitutional: She is oriented to person, place, and time. She appears well-developed and well-nourished. She appears ill. No distress.  HENT:  Head: Normocephalic and atraumatic.  Right Ear: Hearing, tympanic membrane, external ear and ear canal normal.  Left Ear: Hearing, tympanic membrane, external ear and ear canal normal.  Nose: Mucosal edema and rhinorrhea present. Right sinus exhibits no maxillary sinus tenderness and no frontal sinus tenderness. Left sinus exhibits no maxillary sinus tenderness and no frontal sinus tenderness.  Mouth/Throat: Uvula is midline and mucous membranes are normal. Posterior oropharyngeal erythema present.  Eyes: Conjunctivae and EOM are normal.  Neck: Normal range of motion. Neck supple.  Cardiovascular: Normal rate, regular rhythm and normal heart sounds.  No murmur heard. Pulmonary/Chest: Effort normal. No stridor. No respiratory distress. She has decreased breath sounds in the right upper field, the right lower field, the left upper field and the left lower field. She has wheezes in the right upper field and the left upper field. She has rhonchi in the right upper field and  the left upper field. She has no rales.  Musculoskeletal: Normal range of motion.  Lymphadenopathy:    She has no cervical adenopathy.  Neurological: She is alert and oriented to person, place, and time.  Skin: Skin is warm and dry. No rash noted.  Psychiatric: She has a normal mood and affect. Her behavior is normal. Judgment and thought content normal.     UC Treatments / Results  Labs (all labs ordered are listed, but only abnormal results are displayed) Labs Reviewed - No data to  display  EKG  EKG Interpretation None       Radiology No results found.  Procedures Procedures (including critical care time)  Medications Ordered in UC Medications - No data to display   Initial Impression / Assessment and Plan / UC Course  I have reviewed the triage vital signs and the nursing notes.  Pertinent labs & imaging results that were available during my care of the patient were reviewed by me and considered in my medical decision making (see chart for details).    Reviewed with patient that she probably has influenza. Discussed low accuracy of rapid flu tests- will treat based on symptoms and flu activity locally. Recommend start Tamiflu 75mg  twice a day for 5 days. May take Tessalon cough pills 1 every 8 hours as needed. May use Albuterol 2 puffs every 6 hours as needed for wheezing or chest tightness. Increase fluid intake to help loosen up mucus. May continue Ibuprofen or Tylenol as needed for fever and body aches. Rest. Note written for work. Recommend follow-up with her PCP in 3 to 4 days if not improving.    Final Clinical Impressions(s) / UC Diagnoses   Final diagnoses:  Influenza  Cough    ED Discharge Orders        Ordered    oseltamivir (TAMIFLU) 75 MG capsule  2 times daily     02/27/17 1411    benzonatate (TESSALON) 100 MG capsule  3 times daily PRN     02/27/17 1411    albuterol (PROVENTIL HFA;VENTOLIN HFA) 108 (90 Base) MCG/ACT inhaler  Every 6 hours PRN       02/27/17 1411       Controlled Substance Prescriptions [place] Controlled Substance Registry consulted? Not Applicable   Katy Apo, NP 02/27/17 2106

## 2017-02-27 NOTE — Discharge Instructions (Addendum)
Recommend start Tamiflu 75mg  twice a day for 5 days. May take Tessalon cough pills 1 every 8 hours as needed. May use Albuterol 2 puffs every 6 hours as needed for wheezing or chest tightness. Increase fluid intake to help loosen up mucus. May continue Ibuprofen or Tylenol as needed for fever and body aches. Recommend follow-up with your PCP in 3 to 4 days if not improving.

## 2018-03-06 ENCOUNTER — Other Ambulatory Visit: Payer: Self-pay | Admitting: Family Medicine

## 2018-03-06 DIAGNOSIS — R928 Other abnormal and inconclusive findings on diagnostic imaging of breast: Secondary | ICD-10-CM

## 2018-03-06 LAB — RESULTS CONSOLE HPV: CHL HPV: NEGATIVE

## 2018-03-06 LAB — HEMOGLOBIN A1C: Hemoglobin A1C: 5.3

## 2018-03-06 LAB — HM PAP SMEAR: HM Pap smear: NEGATIVE

## 2018-08-28 ENCOUNTER — Other Ambulatory Visit: Payer: Self-pay | Admitting: Family Medicine

## 2018-08-28 DIAGNOSIS — R928 Other abnormal and inconclusive findings on diagnostic imaging of breast: Secondary | ICD-10-CM

## 2018-11-23 ENCOUNTER — Ambulatory Visit
Admission: RE | Admit: 2018-11-23 | Discharge: 2018-11-23 | Disposition: A | Discharge status: Home / Self Care | Payer: Commercial Managed Care - PPO | Source: Ambulatory Visit | Attending: Family Medicine | Admitting: Family Medicine

## 2018-11-23 DIAGNOSIS — R928 Other abnormal and inconclusive findings on diagnostic imaging of breast: Secondary | ICD-10-CM

## 2019-01-09 ENCOUNTER — Other Ambulatory Visit: Payer: Commercial Managed Care - PPO

## 2020-09-29 ENCOUNTER — Encounter: Payer: Self-pay | Admitting: Nurse Practitioner

## 2020-09-29 ENCOUNTER — Other Ambulatory Visit: Payer: Self-pay

## 2020-09-29 ENCOUNTER — Ambulatory Visit (INDEPENDENT_AMBULATORY_CARE_PROVIDER_SITE_OTHER): Payer: Commercial Managed Care - PPO | Admitting: Nurse Practitioner

## 2020-09-29 VITALS — BP 141/89 | HR 81 | Temp 99.2°F | Ht 62.0 in | Wt 167.0 lb

## 2020-09-29 DIAGNOSIS — Z1211 Encounter for screening for malignant neoplasm of colon: Secondary | ICD-10-CM

## 2020-09-29 DIAGNOSIS — N6315 Unspecified lump in the right breast, overlapping quadrants: Secondary | ICD-10-CM

## 2020-09-29 DIAGNOSIS — Z7689 Persons encountering health services in other specified circumstances: Secondary | ICD-10-CM | POA: Diagnosis not present

## 2020-09-29 NOTE — Patient Instructions (Signed)
It was great to see you!  Please call [facility] to set up mammogram:  [facility]  Address: [address], [place], [place] [address]  Phone: [phone]   Let's follow-up in 1-2 months, sooner if you have concerns.  We will be in touch with you about your results if done today.  You will see results as soon as they are available- it takes a MINIMUM of 3 business days for your provider to receive and review these results.  PLEASE ALLOW A MINIMUM OF 3 BUSINESS DAYS FOR A RESPONSE FROM YOUR PROVIDER ON YOUR RESULTS. If results are normal, and you have MyChart, you will receive a message stating they are normal. If you do not have MyChart, we will either call you or mail a letter stating your results are normal.  If your results are abnormal, you may receive a call or a MyChart message depending on the recommendations and any questions we have. If we are unable to reach you by phone, we will mail the results and recommendations to you. ** If you have not heard anything on your results through [place] or a phone all in 7 business days, please contact the office to inquire.   If a referral was placed today, you will be contacted for an appointment. Please note that routine referrals can sometimes take up to 3-4 weeks to process. Please call our office if you haven't heard anything after this time frame.  Take care,  Vance Peper, NP

## 2020-09-29 NOTE — Progress Notes (Signed)
New Patient Office Visit  Subjective:  Patient ID: Lindsay Jennings, female    DOB: April 17, 1975  Age: 45 y.o. MRN: [id number]  CC:  Chief Complaint  Patient presents with   Establish Care    Patient is here to establish care. Patient denies having any concerns at today's visit. Patient state she does have concerns regarding her right breast and would like to discuss a mammogram. Patient has noticed some tenderness under the breast.     HPI Lindsay Jennings presents for new patient visit to establish care.  Introduced to Designer, jewellery role and practice setting.  All questions answered.  Discussed provider/patient relationship and expectations.  BREAST PAIN Duration :days Location: right Onset: sudden Severity: mild Quality: tender Frequency: constant Redness: no Swelling: no Trauma: no trauma Breastfeeding: no Associated with menstral cycle: no Nipple discharge: no Breast lump: yes Status: stable Treatments attempted: none Previous mammogram: yes   Past Medical History:  Diagnosis Date   CIN II (cervical intraepithelial neoplasia II)    H/O LEEP    History of abnormal cervical Pap smear    Left breast mass 04/26/2016    Past Surgical History:  Procedure Laterality Date   CERVICAL BIOPSY  W/ LOOP ELECTRODE EXCISION  12/05/2012   CONE LEEP   CESAREAN SECTION N/A 09/28/2016   Procedure: CESAREAN SECTION;  Surgeon: Malachy Mood, MD;  Location: [facility] ORS;  Service: Obstetrics;  Laterality: N/A;    Family History  Problem Relation Age of Onset   Colon cancer Mother 87   Stroke Father    Lung cancer Maternal Uncle    Breast cancer Maternal Grandmother 71       cancer of the gall bladder    Social History   Socioeconomic History   Marital status: Married    Spouse name: Not on file   Number of children: 2   Years of education: Not on file   Highest education level: Not on file  Occupational History   Not on file  Tobacco Use   Smoking status: Never    Smokeless tobacco: Never  Vaping Use   Vaping Use: Never used  Substance and Sexual Activity   Alcohol use: No   Drug use: No   Sexual activity: Yes    Birth control/protection: None, Condom  Other Topics Concern   Not on file  Social History Narrative   Not on file   Social Determinants of Health   Financial Resource Strain: Not on file  Food Insecurity: Not on file  Transportation Needs: Not on file  Physical Activity: Not on file  Stress: Not on file  Social Connections: Not on file  Intimate Partner Violence: Not on file    ROS Review of Systems  Constitutional:  Positive for fatigue.  HENT: Negative.    Eyes: Negative.   Respiratory: Negative.    Cardiovascular: Negative.   Gastrointestinal: Negative.   Endocrine: Negative.   Genitourinary: Negative.   Musculoskeletal: Negative.   Skin: Negative.   Neurological: Negative.   Psychiatric/Behavioral: Negative.     Objective:   Today's Vitals: BP (!) 141/89 (BP Location: Left Wrist, Cuff Size: Normal)   Pulse 81   Temp 99.2 F (37.3 C) (Oral)   Ht '5\' 2"'$  (1.575 m)   Wt 167 lb (75.8 kg)   LMP 09/18/2020 (Exact Date)   SpO2 99%   BMI 30.54 kg/m   Physical Exam Vitals and nursing note reviewed. Exam conducted with a chaperone present.  Constitutional:  General: She is not in acute distress.    Appearance: Normal appearance.  HENT:     Head: Normocephalic and atraumatic.     Right Ear: Tympanic membrane, ear canal and external ear normal.     Left Ear: Tympanic membrane, ear canal and external ear normal.     Nose: Nose normal.     Mouth/Throat:     Mouth: Mucous membranes are moist.     Pharynx: Oropharynx is clear.  Eyes:     Conjunctiva/sclera: Conjunctivae normal.  Cardiovascular:     Rate and Rhythm: Normal rate and regular rhythm.     Pulses: Normal pulses.     Heart sounds: Normal heart sounds.  Pulmonary:     Effort: Pulmonary effort is normal.     Breath sounds: Normal breath sounds.   Chest:  Breasts:    Right: Mass (6 o'clock) and tenderness present. No inverted nipple.     Left: No inverted nipple or mass.  Abdominal:     General: Bowel sounds are normal.     Palpations: Abdomen is soft.     Tenderness: There is no abdominal tenderness.  Musculoskeletal:        General: Normal range of motion.     Cervical back: Normal range of motion.  Lymphadenopathy:     Upper Body:     Right upper body: No axillary adenopathy.     Left upper body: No axillary adenopathy.  Skin:    General: Skin is warm and dry.  Neurological:     General: No focal deficit present.     Mental Status: She is alert and oriented to person, place, and time.     Cranial Nerves: No cranial nerve deficit.     Coordination: Coordination normal.     Gait: Gait normal.  Psychiatric:        Mood and Affect: Mood normal.        Behavior: Behavior normal.        Thought Content: Thought content normal.        Judgment: Judgment normal.    Assessment & Plan:   Problem List Items Addressed This Visit   None Visit Diagnoses     Breast lump on right side at 6 o'clock position    -  Primary   Noticed in the last few days, tender to touch. Small lump felt on right breast at 6 o'clock position. Diagnsotic mammogram ordered    Relevant Orders   MM DIAG BREAST TOMO BILATERAL   Screen for colon cancer       Referral placed to GI for colonoscopy with history of colorectal cancer in her mother   Relevant Orders   Ambulatory referral to Gastroenterology   Encounter to establish care       Will request records and labs from prior PCP       Outpatient Encounter Medications as of 09/29/2020  Medication Sig   albuterol (PROVENTIL HFA;VENTOLIN HFA) 108 (90 Base) MCG/ACT inhaler Inhale 2 puffs into the lungs every 6 (six) hours as needed for wheezing or shortness of breath. (Patient not taking: Reported on 09/29/2020)   benzonatate (TESSALON) 100 MG capsule Take 1 capsule (100 mg total) by mouth 3  (three) times daily as needed for cough. (Patient not taking: Reported on 09/29/2020)   No facility-administered encounter medications on file as of 09/29/2020.    Follow-up: Return in about 4 weeks (around 10/27/2020) for 1-2 months for physical .   Charyl Dancer, NP

## 2020-09-30 ENCOUNTER — Other Ambulatory Visit: Payer: Self-pay | Admitting: Nurse Practitioner

## 2020-09-30 ENCOUNTER — Telehealth: Payer: Self-pay

## 2020-09-30 ENCOUNTER — Other Ambulatory Visit: Payer: Self-pay

## 2020-09-30 DIAGNOSIS — N6315 Unspecified lump in the right breast, overlapping quadrants: Secondary | ICD-10-CM

## 2020-09-30 DIAGNOSIS — Z1211 Encounter for screening for malignant neoplasm of colon: Secondary | ICD-10-CM

## 2020-09-30 DIAGNOSIS — Z800 Family history of malignant neoplasm of digestive organs: Secondary | ICD-10-CM

## 2020-09-30 MED ORDER — PEG 3350-KCL-NA BICARB-NACL 420 G PO SOLR: 4000.0000 mL | Freq: Once | ORAL | 0 refills | Status: AC

## 2020-09-30 NOTE — Telephone Encounter (Signed)
Procedure has been schedule for 01/15/21.

## 2020-09-30 NOTE — Telephone Encounter (Signed)
LVM yesterday-returning your call to schedule procedure.

## 2020-09-30 NOTE — Progress Notes (Signed)
Gastroenterology Pre-Procedure Review  Request Date: 01/15/21 Requesting Physician: Dr. Vicente Males  PATIENT REVIEW QUESTIONS: The patient responded to the following health history questions as indicated:    1. Are you having any GI issues? no 2. Do you have a personal history of Polyps? no 3. Do you have a family history of Colon Cancer or Polyps? yes (Mother colon cancer) 4. Diabetes Mellitus? no 5. Joint replacements in the past 12 months?no 6. Major health problems in the past 3 months?no 7. Any artificial heart valves, MVP, or defibrillator?no    MEDICATIONS & ALLERGIES:    Patient reports the following regarding taking any anticoagulation/antiplatelet therapy:   Plavix, Coumadin, Eliquis, Xarelto, Lovenox, Pradaxa, Brilinta, or Effient? no Aspirin? no  Patient confirms/reports the following medications:  Current Outpatient Medications  Medication Sig Dispense Refill   albuterol (PROVENTIL HFA;VENTOLIN HFA) 108 (90 Base) MCG/ACT inhaler Inhale 2 puffs into the lungs every 6 (six) hours as needed for wheezing or shortness of breath. (Patient not taking: Reported on 09/29/2020) 1 Inhaler 0   benzonatate (TESSALON) 100 MG capsule Take 1 capsule (100 mg total) by mouth 3 (three) times daily as needed for cough. (Patient not taking: Reported on 09/29/2020) 21 capsule 0   No current facility-administered medications for this visit.    Patient confirms/reports the following allergies:  No Known Allergies  No orders of the defined types were placed in this encounter.   AUTHORIZATION INFORMATION Primary Insurance: 1D#: Group #:  Secondary Insurance: 1D#: Group #:  SCHEDULE INFORMATION: Date: 01/15/21 Time: Location: [place]

## 2020-10-07 ENCOUNTER — Ambulatory Visit
Admission: RE | Admit: 2020-10-07 | Discharge: 2020-10-07 | Disposition: A | Discharge status: Home / Self Care | Payer: Commercial Managed Care - PPO | Source: Ambulatory Visit | Attending: Nurse Practitioner | Admitting: Nurse Practitioner

## 2020-10-07 ENCOUNTER — Other Ambulatory Visit: Payer: Self-pay

## 2020-10-07 DIAGNOSIS — N6315 Unspecified lump in the right breast, overlapping quadrants: Secondary | ICD-10-CM | POA: Diagnosis not present

## 2020-10-16 ENCOUNTER — Encounter: Payer: Self-pay | Admitting: Nurse Practitioner

## 2020-10-28 NOTE — Progress Notes (Signed)
BP 126/77 (BP Location: Left Arm, Patient Position: Sitting)   Pulse 76   Ht 5\' 2"  (1.575 m)   Wt 167 lb (75.8 kg)   LMP 09/29/2020   BMI 30.54 kg/m    Subjective:    Patient ID: Lindsay Jennings, female    DOB: 24-Feb-1975, 45 y.o.   MRN: [id number]  CC:  Chief Complaint  Patient presents with   Annual Exam    HPI: Lindsay Jennings is a 45 y.o. female presenting on 10/29/2020 for comprehensive medical examination. Current medical complaints include:none  She currently lives with: husband and child Menopausal Symptoms: no  Depression Screen done today and results listed below:  Depression screen [facility] 2/9 10/29/2020 09/29/2020  Decreased Interest 0 0  Down, Depressed, Hopeless 0 0  PHQ - 2 Score 0 0  Altered sleeping 1 -  Tired, decreased energy 1 -  Change in appetite 0 -  Feeling bad or failure about yourself  0 -  Trouble concentrating 0 -  Moving slowly or fidgety/restless 0 -  Suicidal thoughts 0 -  PHQ-9 Score 2 -  Difficult doing work/chores Not difficult at all -   GAD 7 : Generalized Anxiety Score 10/29/2020  Nervous, Anxious, on Edge 0  Control/stop worrying 0  Worry too much - different things 0  Trouble relaxing 0  Restless 0  Easily annoyed or irritable 0  Afraid - awful might happen 0  Total GAD 7 Score 0  Anxiety Difficulty Not difficult at all    The patient does not have a history of falls. I did not complete a risk assessment for falls. A plan of care for falls was not documented.  INSOMNIA  Duration: years Satisfied with sleep quality: no Difficulty falling asleep: no Difficulty staying asleep: yes Waking a few hours after sleep onset: yes Early morning awakenings: no Daytime hypersomnolence: no Wakes feeling refreshed: no Good sleep hygiene: yes Apnea: no Snoring: no Depressed/anxious mood: no Recent stress: yes Restless legs/nocturnal leg cramps: no Chronic pain/arthritis: no History of sleep study: no Treatments attempted:  melatonin    Past Medical History:  Past Medical History:  Diagnosis Date   CIN II (cervical intraepithelial neoplasia II)    H/O LEEP    History of abnormal cervical Pap smear    Left breast mass 04/26/2016    Surgical History:  Past Surgical History:  Procedure Laterality Date   CERVICAL BIOPSY  W/ LOOP ELECTRODE EXCISION  12/05/2012   CONE LEEP   CESAREAN SECTION N/A 09/28/2016   Procedure: CESAREAN SECTION;  Surgeon: Malachy Mood, MD;  Location: [facility] ORS;  Service: Obstetrics;  Laterality: N/A;    Medications:  Current Outpatient Medications on File Prior to Visit  Medication Sig   albuterol (PROVENTIL HFA;VENTOLIN HFA) 108 (90 Base) MCG/ACT inhaler Inhale 2 puffs into the lungs every 6 (six) hours as needed for wheezing or shortness of breath.   benzonatate (TESSALON) 100 MG capsule Take 1 capsule (100 mg total) by mouth 3 (three) times daily as needed for cough.   No current facility-administered medications on file prior to visit.    Allergies:  No Known Allergies  Social History:  Social History   Socioeconomic History   Marital status: Married    Spouse name: Not on file   Number of children: 2   Years of education: Not on file   Highest education level: Not on file  Occupational History   Not on file  Tobacco Use   Smoking  status: Never   Smokeless tobacco: Never  Vaping Use   Vaping Use: Never used  Substance and Sexual Activity   Alcohol use: No   Drug use: No   Sexual activity: Yes    Birth control/protection: None, Condom  Other Topics Concern   Not on file  Social History Narrative   Not on file   Social Determinants of Health   Financial Resource Strain: Not on file  Food Insecurity: Not on file  Transportation Needs: Not on file  Physical Activity: Not on file  Stress: Not on file  Social Connections: Not on file  Intimate Partner Violence: Not on file   Social History   Tobacco Use  Smoking Status Never  Smokeless Tobacco  Never   Social History   Substance and Sexual Activity  Alcohol Use No    Family History:  Family History  Problem Relation Age of Onset   Colon cancer Mother 52   Stroke Father    Lung cancer Maternal Uncle    Breast cancer Maternal Grandmother 71       cancer of the gall bladder    Past medical history, surgical history, medications, allergies, family history and social history reviewed with patient today and changes made to appropriate areas of the chart.   Review of Systems  Constitutional:  Positive for malaise/fatigue.  HENT: Negative.    Eyes: Negative.   Respiratory: Negative.    Cardiovascular: Negative.   Gastrointestinal: Negative.   Genitourinary: Negative.   Musculoskeletal: Negative.   Skin: Negative.   Neurological:  Positive for headaches (chronic, stable).  Psychiatric/Behavioral:  The patient has insomnia. The patient is not nervous/anxious.   All other ROS negative except what is listed above and in the HPI.      Objective:    BP 126/77 (BP Location: Left Arm, Patient Position: Sitting)   Pulse 76   Ht 5\' 2"  (1.575 m)   Wt 167 lb (75.8 kg)   LMP 09/29/2020   BMI 30.54 kg/m   Wt Readings from Last 3 Encounters:  10/29/20 167 lb (75.8 kg)  09/29/20 167 lb (75.8 kg)  02/27/17 189 lb (85.7 kg)    Physical Exam Vitals and nursing note reviewed.  Constitutional:      General: She is not in acute distress.    Appearance: Normal appearance.  HENT:     Head: Normocephalic and atraumatic.     Right Ear: Tympanic membrane, ear canal and external ear normal.     Left Ear: Tympanic membrane, ear canal and external ear normal.     Nose: Nose normal.     Mouth/Throat:     Mouth: Mucous membranes are moist.     Pharynx: Oropharynx is clear.  Eyes:     Conjunctiva/sclera: Conjunctivae normal.  Cardiovascular:     Rate and Rhythm: Normal rate and regular rhythm.     Pulses: Normal pulses.     Heart sounds: Normal heart sounds.  Pulmonary:      Effort: Pulmonary effort is normal.     Breath sounds: Normal breath sounds.  Abdominal:     General: Bowel sounds are normal.     Palpations: Abdomen is soft.     Tenderness: There is no abdominal tenderness.  Musculoskeletal:        General: Normal range of motion.     Cervical back: Normal range of motion.     Comments: Strength 5/5 in bilateral upper and lower extremities   Skin:  General: Skin is warm and dry.  Neurological:     General: No focal deficit present.     Mental Status: She is alert and oriented to person, place, and time.     Cranial Nerves: No cranial nerve deficit.     Coordination: Coordination normal.     Gait: Gait normal.  Psychiatric:        Mood and Affect: Mood normal.        Behavior: Behavior normal.        Thought Content: Thought content normal.        Judgment: Judgment normal.    Results for orders placed or performed in visit on 10/16/20  HM PAP SMEAR  Result Value Ref Range   HM Pap smear negative   Hemoglobin A1c  Result Value Ref Range   Hemoglobin A1C 5.3   HM HIV SCREENING LAB  Result Value Ref Range   HM HIV Screening Negative - Validated   Results Console HPV  Result Value Ref Range   CHL HPV Negative       Assessment & Plan:   Problem List Items Addressed This Visit       Other   Insomnia    Chronic, ongoing. Has tried melatonin OTC and she noticed it made her feet feel like they were tingling. Will trial trazodone qHS prn. Information given on sleep hygiene. Follow up in 4-6 weeks.       Other Visit Diagnoses     Routine general medical examination at a health care facility    -  Primary   Health maintenance reviewed and updated. Check CMP, CBC, TSH today   Relevant Orders   CBC with Differential/Platelet   Comprehensive metabolic panel   TSH   Encounter for lipid screening for cardiovascular disease       check lipid panel today   Relevant Orders   Lipid Panel w/o Chol/HDL Ratio        Follow up  plan: Return in about 4 weeks (around 11/26/2020) for 4-6 weeks may be virtual for insomnia.   LABORATORY TESTING:  - Pap smear: up to date  IMMUNIZATIONS:   - Tdap: Tetanus vaccination status reviewed: last tetanus booster within 10 years. - Influenza:  will get at work  - Pneumovax: Not applicable - Prevnar: Not applicable - HPV: Not applicable - Zostavax vaccine: Not applicable  SCREENING: -Mammogram: Up to date  - Colonoscopy:  scheduled for colonoscopy in December 2022   - Bone Density: Not applicable  -Hearing Test: Not applicable  -Spirometry: Not applicable   PATIENT COUNSELING:   Advised to take 1 mg of folate supplement per day if capable of pregnancy.   Sexuality: Discussed sexually transmitted diseases, partner selection, use of condoms, avoidance of unintended pregnancy  and contraceptive alternatives.   Advised to avoid cigarette smoking.  I discussed with the patient that most people either abstain from alcohol or drink within safe limits (<=14/week and <=4 drinks/occasion for males, <=7/weeks and <= 3 drinks/occasion for females) and that the risk for alcohol disorders and other health effects rises proportionally with the number of drinks per week and how often a drinker exceeds daily limits.  Discussed cessation/primary prevention of drug use and availability of treatment for abuse.   Diet: Encouraged to adjust caloric intake to maintain  or achieve ideal body weight, to reduce intake of dietary saturated fat and total fat, to limit sodium intake by avoiding high sodium foods and not adding table salt, and to  maintain adequate dietary potassium and calcium preferably from fresh fruits, vegetables, and low-fat dairy products.    stressed the importance of regular exercise  Injury prevention: Discussed safety belts, safety helmets, smoke detector, smoking near bedding or upholstery.   Dental health: Discussed importance of regular tooth brushing, flossing, and  dental visits.    NEXT PREVENTATIVE PHYSICAL DUE IN 1 YEAR. Return in about 4 weeks (around 11/26/2020) for 4-6 weeks may be virtual for insomnia.

## 2020-10-29 ENCOUNTER — Encounter: Payer: Self-pay | Admitting: Nurse Practitioner

## 2020-10-29 ENCOUNTER — Ambulatory Visit (INDEPENDENT_AMBULATORY_CARE_PROVIDER_SITE_OTHER): Payer: Commercial Managed Care - PPO | Admitting: Nurse Practitioner

## 2020-10-29 ENCOUNTER — Other Ambulatory Visit: Payer: Self-pay

## 2020-10-29 VITALS — BP 126/77 | HR 76 | Ht 62.0 in | Wt 167.0 lb

## 2020-10-29 DIAGNOSIS — Z0000 Encounter for general adult medical examination without abnormal findings: Secondary | ICD-10-CM

## 2020-10-29 DIAGNOSIS — G4700 Insomnia, unspecified: Secondary | ICD-10-CM | POA: Diagnosis not present

## 2020-10-29 DIAGNOSIS — Z136 Encounter for screening for cardiovascular disorders: Secondary | ICD-10-CM | POA: Diagnosis not present

## 2020-10-29 DIAGNOSIS — Z13220 Encounter for screening for lipoid disorders: Secondary | ICD-10-CM

## 2020-10-29 MED ORDER — TRAZODONE HCL 50 MG PO TABS: 25.0000 mg | ORAL_TABLET | Freq: Every evening | ORAL | 0 refills | Status: DC | PRN

## 2020-10-29 NOTE — Assessment & Plan Note (Signed)
Chronic, ongoing. Has tried melatonin OTC and she noticed it made her feet feel like they were tingling. Will trial trazodone qHS prn. Information given on sleep hygiene. Follow up in 4-6 weeks.

## 2020-10-30 LAB — TSH: TSH: 2.8 u[IU]/mL (ref 0.450–4.500)

## 2020-10-30 LAB — COMPREHENSIVE METABOLIC PANEL
ALT: 6 IU/L (ref 0–32)
AST: 13 IU/L (ref 0–40)
Albumin/Globulin Ratio: 1.6 (ref 1.2–2.2)
Albumin: 3.9 g/dL (ref 3.8–4.8)
Alkaline Phosphatase: 103 IU/L (ref 44–121)
BUN/Creatinine Ratio: 8 — ABNORMAL LOW (ref 9–23)
BUN: 6 mg/dL (ref 6–24)
Bilirubin Total: 0.4 mg/dL (ref 0.0–1.2)
CO2: 23 mmol/L (ref 20–29)
Calcium: 9.4 mg/dL (ref 8.7–10.2)
Chloride: 102 mmol/L (ref 96–106)
Creatinine, Ser: 0.77 mg/dL (ref 0.57–1.00)
Globulin, Total: 2.5 g/dL (ref 1.5–4.5)
Glucose: 86 mg/dL (ref 70–99)
Potassium: 4.1 mmol/L (ref 3.5–5.2)
Sodium: 138 mmol/L (ref 134–144)
Total Protein: 6.4 g/dL (ref 6.0–8.5)
eGFR: 96 mL/min/{1.73_m2} (ref >59)

## 2020-10-30 LAB — CBC WITH DIFFERENTIAL/PLATELET
Basophils Absolute: 0.1 10*3/uL (ref 0.0–0.2)
Basos: 1 %
EOS (ABSOLUTE): 0.1 10*3/uL (ref 0.0–0.4)
Eos: 2 %
Hematocrit: 38.6 % (ref 34.0–46.6)
Hemoglobin: 12.4 g/dL (ref 11.1–15.9)
Immature Grans (Abs): 0 10*3/uL (ref 0.0–0.1)
Immature Granulocytes: 0 %
Lymphocytes Absolute: 2.6 10*3/uL (ref 0.7–3.1)
Lymphs: 32 %
MCH: 27.5 pg (ref 26.6–33.0)
MCHC: 32.1 g/dL (ref 31.5–35.7)
MCV: 86 fL (ref 79–97)
Monocytes Absolute: 0.6 10*3/uL (ref 0.1–0.9)
Monocytes: 8 %
Neutrophils Absolute: 4.6 10*3/uL (ref 1.4–7.0)
Neutrophils: 57 %
Platelets: 170 10*3/uL (ref 150–450)
RBC: 4.51 x10E6/uL (ref 3.77–5.28)
RDW: 12.2 % (ref 11.7–15.4)
WBC: 8 10*3/uL (ref 3.4–10.8)

## 2020-10-30 LAB — LIPID PANEL W/O CHOL/HDL RATIO
Cholesterol, Total: 169 mg/dL (ref 100–199)
HDL: 64 mg/dL (ref >39)
LDL Chol Calc (NIH): 95 mg/dL (ref 0–99)
Triglycerides: 50 mg/dL (ref 0–149)
VLDL Cholesterol Cal: 10 mg/dL (ref 5–40)

## 2020-11-22 ENCOUNTER — Other Ambulatory Visit: Payer: Self-pay | Admitting: Nurse Practitioner

## 2020-11-22 NOTE — Telephone Encounter (Signed)
Requested medication (s) are due for refill today: yes  Requested medication (s) are on the active medication list: yes  Last refill:  10/29/20  Future visit scheduled: yes in 4 days  Notes to clinic:  med was just started as is due for f/u 90 day refill inappropriate   Requested Prescriptions  Pending Prescriptions Disp Refills   traZODone (DESYREL) 50 MG tablet [Pharmacy Med Name: TRAZODONE 50 MG TABLET] 90 tablet 1    Sig: TAKE 0.5-1 TABLETS BY MOUTH AT BEDTIME AS NEEDED FOR SLEEP.     Psychiatry: Antidepressants - Serotonin Modulator Passed - 11/22/2020  2:33 PM      Passed - Valid encounter within last 6 months    Recent Outpatient Visits           3 weeks ago Routine general medical examination at a health care facility   [place], NP   1 month ago Breast lump on right side at 6 o'clock position   [facility], Scheryl Darter, NP       Future Appointments             In 4 days Jon Billings, NP [facility], [place]

## 2020-11-25 NOTE — Progress Notes (Deleted)
There were no vitals taken for this visit.   Subjective:    Patient ID: Lindsay Jennings, female    DOB: [birth year], 45 y.o.   MRN: [id number]  HPI: Lindsay Jennings is a 45 y.o. female  No chief complaint on file.  SLEEP DISTURBANCE   Relevant past medical, surgical, family and social history reviewed and updated as indicated. Interim medical history since our last visit reviewed. Allergies and medications reviewed and updated.  Review of Systems  Per HPI unless specifically indicated above     Objective:    There were no vitals taken for this visit.  Wt Readings from Last 3 Encounters:  10/29/20 167 lb (75.8 kg)  09/29/20 167 lb (75.8 kg)  02/27/17 189 lb (85.7 kg)    Physical Exam  Results for orders placed or performed in visit on 10/29/20  CBC with Differential/Platelet  Result Value Ref Range   WBC 8.0 3.4 - 10.8 x10E3/uL   RBC 4.51 3.77 - 5.28 x10E6/uL   Hemoglobin 12.4 11.1 - 15.9 g/dL   Hematocrit 38.6 34.0 - 46.6 %   MCV 86 79 - 97 fL   MCH 27.5 26.6 - 33.0 pg   MCHC 32.1 31.5 - 35.7 g/dL   RDW 12.2 11.7 - 15.4 %   Platelets 170 150 - 450 x10E3/uL   Neutrophils 57 Not Estab. %   Lymphs 32 Not Estab. %   Monocytes 8 Not Estab. %   Eos 2 Not Estab. %   Basos 1 Not Estab. %   Neutrophils Absolute 4.6 1.4 - 7.0 x10E3/uL   Lymphocytes Absolute 2.6 0.7 - 3.1 x10E3/uL   Monocytes Absolute 0.6 0.1 - 0.9 x10E3/uL   EOS (ABSOLUTE) 0.1 0.0 - 0.4 x10E3/uL   Basophils Absolute 0.1 0.0 - 0.2 x10E3/uL   Immature Granulocytes 0 Not Estab. %   Immature Grans (Abs) 0.0 0.0 - 0.1 x10E3/uL  Comprehensive metabolic panel  Result Value Ref Range   Glucose 86 70 - 99 mg/dL   BUN 6 6 - 24 mg/dL   Creatinine, Ser 0.77 0.57 - 1.00 mg/dL   eGFR 96 >59 mL/min/1.73   BUN/Creatinine Ratio 8 (L) 9 - 23   Sodium 138 134 - 144 mmol/L   Potassium 4.1 3.5 - 5.2 mmol/L   Chloride 102 96 - 106 mmol/L   CO2 23 20 - 29 mmol/L   Calcium 9.4 8.7 - 10.2 mg/dL   Total Protein  6.4 6.0 - 8.5 g/dL   Albumin 3.9 3.8 - 4.8 g/dL   Globulin, Total 2.5 1.5 - 4.5 g/dL   Albumin/Globulin Ratio 1.6 1.2 - 2.2   Bilirubin Total 0.4 0.0 - 1.2 mg/dL   Alkaline Phosphatase 103 44 - 121 IU/L   AST 13 0 - 40 IU/L   ALT 6 0 - 32 IU/L  Lipid Panel w/o Chol/HDL Ratio  Result Value Ref Range   Cholesterol, Total 169 100 - 199 mg/dL   Triglycerides 50 0 - 149 mg/dL   HDL 64 >39 mg/dL   VLDL Cholesterol Cal 10 5 - 40 mg/dL   LDL Chol Calc (NIH) 95 0 - 99 mg/dL  TSH  Result Value Ref Range   TSH 2.800 0.450 - 4.500 uIU/mL      Assessment & Plan:   Problem List Items Addressed This Visit       Other   Insomnia - Primary     Follow up plan: No follow-ups on file.

## 2020-11-26 ENCOUNTER — Telehealth: Payer: Commercial Managed Care - PPO | Admitting: Nurse Practitioner

## 2020-11-26 DIAGNOSIS — G4700 Insomnia, unspecified: Secondary | ICD-10-CM

## 2021-01-14 ENCOUNTER — Encounter: Payer: Self-pay | Admitting: Gastroenterology

## 2021-01-15 ENCOUNTER — Ambulatory Visit: Payer: Commercial Managed Care - PPO | Admitting: Anesthesiology

## 2021-01-15 ENCOUNTER — Encounter
Admission: RE | Disposition: A | Discharge status: Home / Self Care | Payer: Self-pay | Source: Home / Self Care | Attending: Gastroenterology

## 2021-01-15 ENCOUNTER — Encounter: Payer: Self-pay | Admitting: Gastroenterology

## 2021-01-15 ENCOUNTER — Ambulatory Visit
Admission: RE | Admit: 2021-01-15 | Discharge: 2021-01-15 | Disposition: A | Discharge status: Home / Self Care | Payer: Commercial Managed Care - PPO | Attending: Gastroenterology | Admitting: Gastroenterology

## 2021-01-15 DIAGNOSIS — D122 Benign neoplasm of ascending colon: Secondary | ICD-10-CM | POA: Diagnosis not present

## 2021-01-15 DIAGNOSIS — K621 Rectal polyp: Secondary | ICD-10-CM | POA: Insufficient documentation

## 2021-01-15 DIAGNOSIS — Z800 Family history of malignant neoplasm of digestive organs: Secondary | ICD-10-CM | POA: Insufficient documentation

## 2021-01-15 DIAGNOSIS — D126 Benign neoplasm of colon, unspecified: Secondary | ICD-10-CM | POA: Diagnosis not present

## 2021-01-15 DIAGNOSIS — Z1211 Encounter for screening for malignant neoplasm of colon: Secondary | ICD-10-CM | POA: Insufficient documentation

## 2021-01-15 HISTORY — PX: COLONOSCOPY WITH PROPOFOL: SHX5780

## 2021-01-15 LAB — POCT PREGNANCY, URINE: Preg Test, Ur: NEGATIVE

## 2021-01-15 SURGERY — COLONOSCOPY WITH PROPOFOL
Anesthesia: General

## 2021-01-15 MED ORDER — PROPOFOL 500 MG/50ML IV EMUL
INTRAVENOUS | Status: AC
  Filled 2021-01-15: qty 50

## 2021-01-15 MED ORDER — STERILE WATER FOR IRRIGATION IR SOLN
Status: DC | PRN
  Administered 2021-01-15 (×2): 60 mL

## 2021-01-15 MED ORDER — LIDOCAINE HCL (CARDIAC) PF 100 MG/5ML IV SOSY
PREFILLED_SYRINGE | INTRAVENOUS | Status: DC | PRN
  Administered 2021-01-15: 100 mg via INTRAVENOUS

## 2021-01-15 MED ORDER — PROPOFOL 500 MG/50ML IV EMUL
INTRAVENOUS | Status: DC | PRN
  Administered 2021-01-15: 130 ug/kg/min via INTRAVENOUS

## 2021-01-15 MED ORDER — PROPOFOL 10 MG/ML IV BOLUS
INTRAVENOUS | Status: DC | PRN
  Administered 2021-01-15: 100 mg via INTRAVENOUS

## 2021-01-15 MED ORDER — SODIUM CHLORIDE 0.9 % IV SOLN
INTRAVENOUS | Status: DC
  Administered 2021-01-15: 07:00:00 1000 mL via INTRAVENOUS

## 2021-01-15 NOTE — Transfer of Care (Signed)
Immediate Anesthesia Transfer of Care Note  Patient: Lindsay Jennings [facility]  Procedure(s) Performed: COLONOSCOPY WITH PROPOFOL  Patient Location: PACU and Endoscopy Unit  Anesthesia Type:General  Level of Consciousness: drowsy  Airway & Oxygen Therapy: Patient Spontanous Breathing  Post-op Assessment: Report given to RN  Post vital signs: stable  Last Vitals:  Vitals Value Taken Time  BP    Temp    Pulse    Resp    SpO2      Last Pain:  Vitals:   01/15/21 0711  TempSrc: Temporal  PainSc: 0-No pain         Complications: No notable events documented.

## 2021-01-15 NOTE — Anesthesia Preprocedure Evaluation (Addendum)
Anesthesia Evaluation  Patient identified by MRN, date of birth, ID band Patient awake    Reviewed: Allergy & Precautions, NPO status , Patient's Chart, lab work & pertinent test results  Airway Mallampati: II  TM Distance: >3 FB Neck ROM: Full    Dental  (+) Missing,    Pulmonary neg pulmonary ROS,    Pulmonary exam normal breath sounds clear to auscultation       Cardiovascular negative cardio ROS Normal cardiovascular exam Rhythm:Regular Rate:Normal     Neuro/Psych negative neurological ROS  negative psych ROS   GI/Hepatic negative GI ROS, Neg liver ROS,   Endo/Other  negative endocrine ROS  Renal/GU negative Renal ROS  negative genitourinary   Musculoskeletal negative musculoskeletal ROS (+)   Abdominal (+) + obese,   Peds negative pediatric ROS (+)  Hematology negative hematology ROS (+)   Anesthesia Other Findings   Reproductive/Obstetrics negative OB ROS                            Anesthesia Physical Anesthesia Plan  ASA: 2  Anesthesia Plan: General   Post-op Pain Management:    Induction: Intravenous  PONV Risk Score and Plan: 3 and Treatment may vary due to age or medical condition and TIVA  Airway Management Planned: Natural Airway and Nasal Cannula  Additional Equipment:   Intra-op Plan:   Post-operative Plan:   Informed Consent: I have reviewed the patients History and Physical, chart, labs and discussed the procedure including the risks, benefits and alternatives for the proposed anesthesia with the patient or authorized representative who has indicated his/her understanding and acceptance.     Dental Advisory Given  Plan Discussed with: Anesthesiologist, CRNA and Surgeon  Anesthesia Plan Comments:         Anesthesia Quick Evaluation

## 2021-01-15 NOTE — H&P (Signed)
Jonathon Bellows, MD [address], [place], [place], [place], [address] [address], [place], [place], [place], [address] Phone: [phone]  Fax: [phone]  Primary Care Physician:  Jon Billings, NP   Pre-Procedure History & Physical: HPI:  Lindsay Jennings is a 45 y.o. female is here for an colonoscopy.   Past Medical History:  Diagnosis Date   CIN II (cervical intraepithelial neoplasia II)    H/O LEEP    History of abnormal cervical Pap smear    Left breast mass 04/26/2016    Past Surgical History:  Procedure Laterality Date   CERVICAL BIOPSY  W/ LOOP ELECTRODE EXCISION  12/05/2012   CONE LEEP   CESAREAN SECTION N/A 09/28/2016   Procedure: CESAREAN SECTION;  Surgeon: Malachy Mood, MD;  Location: [facility] ORS;  Service: Obstetrics;  Laterality: N/A;    Prior to Admission medications   Medication Sig Start Date End Date Taking? Authorizing Provider  traZODone (DESYREL) 50 MG tablet Take 0.5-1 tablets (25-50 mg total) by mouth at bedtime as needed for sleep. 10/29/20  Yes [place], Scheryl Darter, NP    Allergies as of 09/30/2020   (No Known Allergies)    Family History  Problem Relation Age of Onset   Colon cancer Mother 85   Stroke Father    Lung cancer Maternal Uncle    Breast cancer Maternal Grandmother 71       cancer of the gall bladder    Social History   Socioeconomic History   Marital status: Married    Spouse name: Not on file   Number of children: 2   Years of education: Not on file   Highest education level: Not on file  Occupational History   Not on file  Tobacco Use   Smoking status: Never   Smokeless tobacco: Never  Vaping Use   Vaping Use: Never used  Substance and Sexual Activity   Alcohol use: No   Drug use: No   Sexual activity: Yes    Birth control/protection: None, Condom  Other Topics Concern   Not on file  Social History Narrative   Not on file   Social Determinants of Health   Financial Resource Strain: Not on  file  Food Insecurity: Not on file  Transportation Needs: Not on file  Physical Activity: Not on file  Stress: Not on file  Social Connections: Not on file  Intimate Partner Violence: Not on file    Review of Systems: See HPI, otherwise negative ROS  Physical Exam: BP (!) 152/100    Pulse 83    Temp 97.6 F (36.4 C) (Temporal)    Resp 16    Ht 5\' 2"  (1.575 m)    Wt 75 kg    LMP 12/04/2020 Comment: Pregnancy test negative   SpO2 100%    BMI 30.24 kg/m  General:   Alert,  pleasant and cooperative in NAD Head:  Normocephalic and atraumatic. Neck:  Supple; no masses or thyromegaly. Lungs:  Clear throughout to auscultation, normal respiratory effort.    Heart:  +S1, +S2, Regular rate and rhythm, No edema. Abdomen:  Soft, nontender and nondistended. Normal bowel sounds, without guarding, and without rebound.   Neurologic:  Alert and  oriented x4;  grossly normal neurologically.  Impression/Plan: Lindsay Jennings is here for an colonoscopy to be performed for Screening colonoscopy , mother had rectal cancer Risks, benefits, limitations, and alternatives regarding  colonoscopy have been reviewed with the patient.  Questions have been answered.  All parties agreeable.   Jonathon Bellows, MD  01/15/2021, 7:33 AM

## 2021-01-15 NOTE — Anesthesia Postprocedure Evaluation (Signed)
Anesthesia Post Note  Patient: Lindsay Jennings  Procedure(s) Performed: COLONOSCOPY WITH PROPOFOL  Patient location during evaluation: Endoscopy Anesthesia Type: General Level of consciousness: awake and alert Pain management: pain level controlled Vital Signs Assessment: post-procedure vital signs reviewed and stable Respiratory status: spontaneous breathing, nonlabored ventilation and respiratory function stable Cardiovascular status: blood pressure returned to baseline and stable Postop Assessment: no apparent nausea or vomiting Anesthetic complications: no   No notable events documented.   Last Vitals:  Vitals:   01/15/21 0711 01/15/21 0802  BP: (!) 152/100   Pulse: 83   Resp: 16   Temp: 36.4 C (!) 36.2 C  SpO2: 100%     Last Pain:  Vitals:   01/15/21 0832  TempSrc:   PainSc: 0-No pain                 [place] Ouch

## 2021-01-15 NOTE — Op Note (Signed)
[facility] Gastroenterology Patient Name: Lindsay Jennings Procedure Date: 01/15/2021 7:33 AM MRN: [id number] Account #: [id number] Date of Birth: [birth year] Admit Type: Outpatient Age: 45 Room: [facility] ENDO ROOM 4 Gender: Female Note Status: Finalized Instrument Name: Park Meo [id number] Procedure:             Colonoscopy Indications:           Colon cancer screening in patient at increased risk:                         Colorectal cancer in mother Providers:             Jonathon Bellows MD, MD Referring MD:          Jon Billings (Referring MD) Medicines:             Monitored Anesthesia Care Complications:         No immediate complications. Procedure:             Pre-Anesthesia Assessment:                        - Prior to the procedure, a History and Physical was                         performed, and patient medications, allergies and                         sensitivities were reviewed. The patient's tolerance                         of previous anesthesia was reviewed.                        - The risks and benefits of the procedure and the                         sedation options and risks were discussed with the                         patient. All questions were answered and informed                         consent was obtained.                        - ASA Grade Assessment: II - A patient with mild                         systemic disease.                        After obtaining informed consent, the colonoscope was                         passed under direct vision. Throughout the procedure,                         the patient's blood pressure, pulse, and oxygen                         saturations were  monitored continuously. The                         Colonoscope was introduced through the anus and                         advanced to the the cecum, identified by the                         appendiceal orifice. The colonoscopy was performed                          with ease. The patient tolerated the procedure well.                         The quality of the bowel preparation was adequate to                         identify polyps. Findings:      The perianal and digital rectal examinations were normal.      Two sessile polyps were found in the rectum and ascending colon. The       polyps were 5 to 7 mm in size. These polyps were removed with a cold       snare. Resection and retrieval were complete.      The exam was otherwise without abnormality on direct and retroflexion       views. Impression:            - Two 5 to 7 mm polyps in the rectum and in the                         ascending colon, removed with a cold snare. Resected                         and retrieved.                        - The examination was otherwise normal on direct and                         retroflexion views. Recommendation:        - Discharge patient to home (with escort).                        - Resume previous diet.                        - Continue present medications.                        - Await pathology results.                        - Repeat colonoscopy in 5 years for surveillance. Procedure Code(s):     --- Professional ---                        [phone], Colonoscopy, flexible; with removal of  tumor(s), polyp(s), or other lesion(s) by snare                         technique Diagnosis Code(s):     --- Professional ---                        Z80.0, Family history of malignant neoplasm of                         digestive organs                        K62.1, Rectal polyp                        K63.5, Polyp of colon CPT copyright 2019 American Medical Association. All rights reserved. The codes documented in this report are preliminary and upon coder review may  be revised to meet current compliance requirements. Jonathon Bellows, MD Jonathon Bellows MD, MD 01/15/2021 8:01:54 AM This report has been signed electronically. Number of  Addenda: 0 Note Initiated On: 01/15/2021 7:33 AM Scope Withdrawal Time: 0 hours 13 minutes 14 seconds  Total Procedure Duration: 0 hours 18 minutes 34 seconds  Estimated Blood Loss:  Estimated blood loss: none.      [facility]

## 2021-01-19 ENCOUNTER — Encounter: Payer: Self-pay | Admitting: Gastroenterology

## 2021-01-19 LAB — SURGICAL PATHOLOGY

## 2021-01-24 ENCOUNTER — Encounter: Payer: Self-pay | Admitting: Gastroenterology

## 2022-07-09 IMAGING — MG DIGITAL DIAGNOSTIC BILAT W/ TOMO W/ CAD
6 of 10 series · 6 of 30 positions shown · non-contrast
Comparison: Previous exam(s).

CLINICAL DATA: Palpable RIGHT breast area.  Painful.  BB-sized

EXAM:
DIGITAL DIAGNOSTIC BILATERAL MAMMOGRAM WITH TOMOSYNTHESIS AND CAD;
ULTRASOUND RIGHT BREAST LIMITED
TECHNIQUE: Bilateral digital diagnostic mammography and breast tomosynthesis
was performed. The images were evaluated with computer-aided
detection.; Targeted ultrasound examination of the right breast was
performed

[R CC synth-2D]
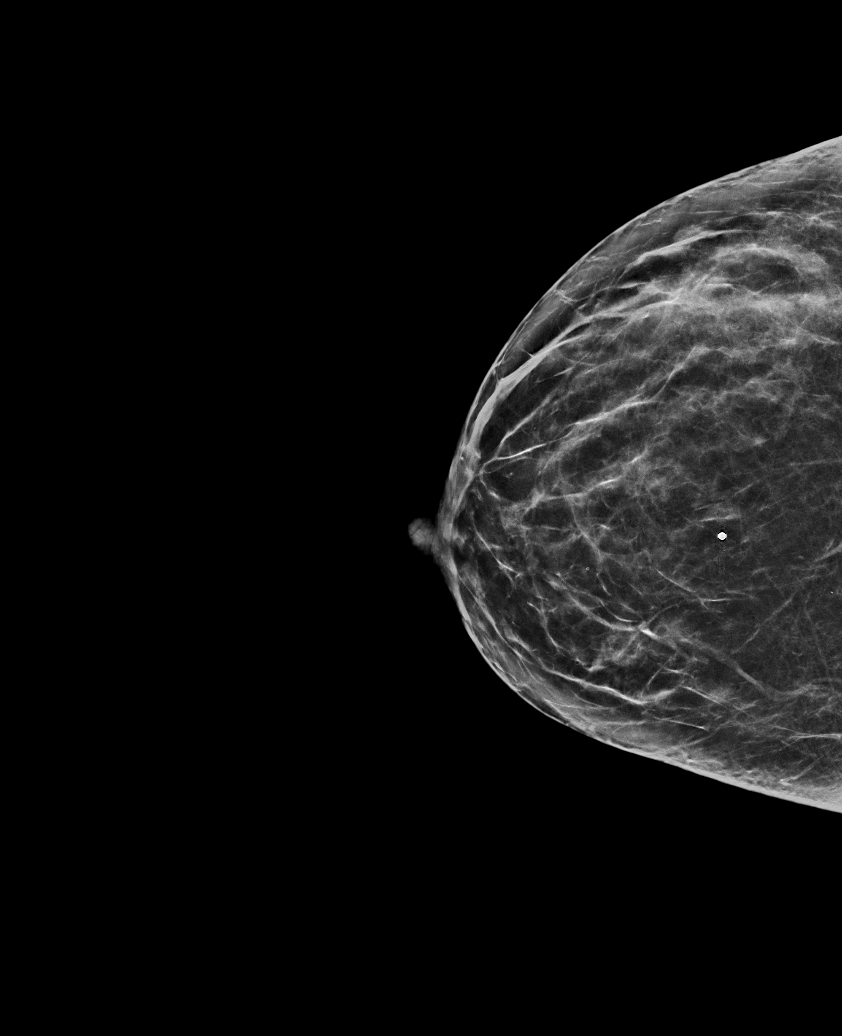

[L CC synth-2D]
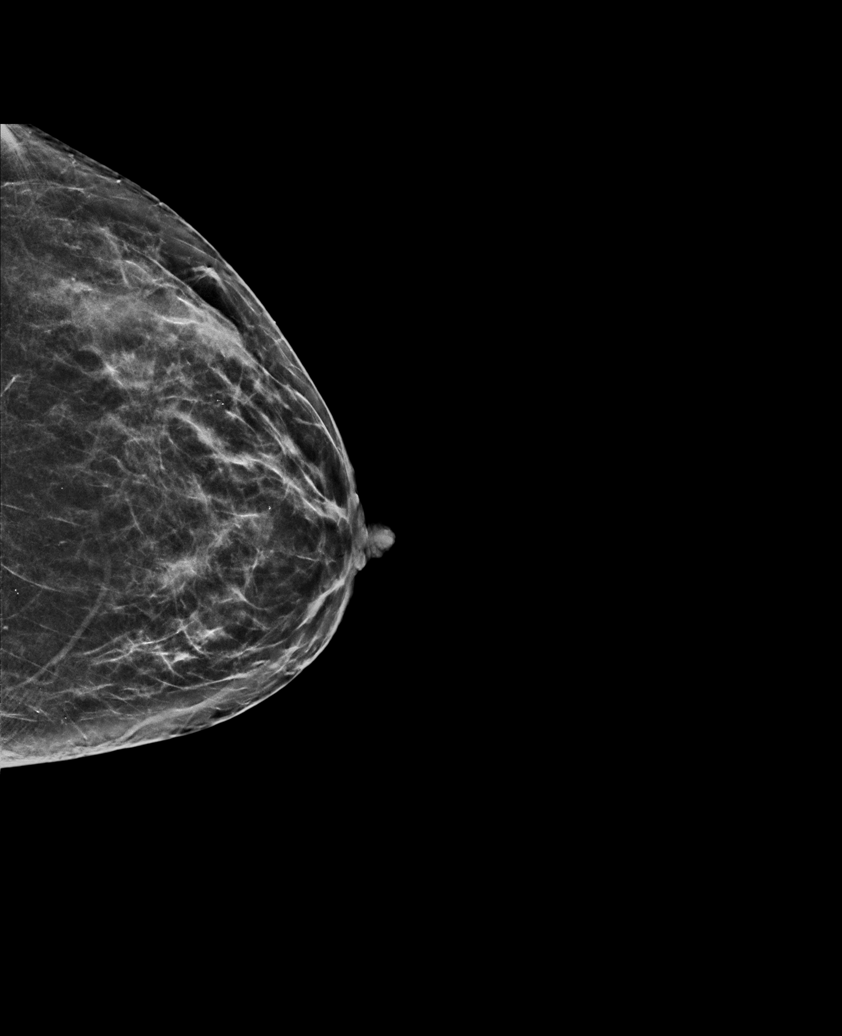

[L MLO synth-2D]
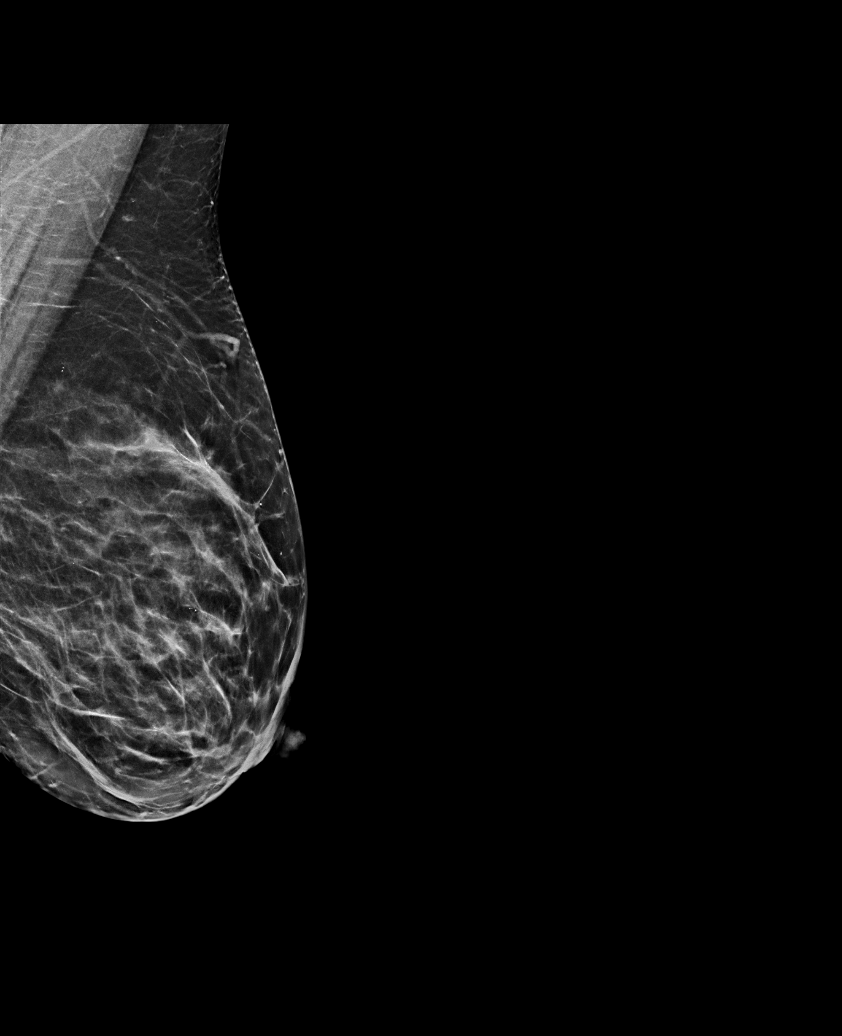

[R MLO synth-2D (1 of 2)]
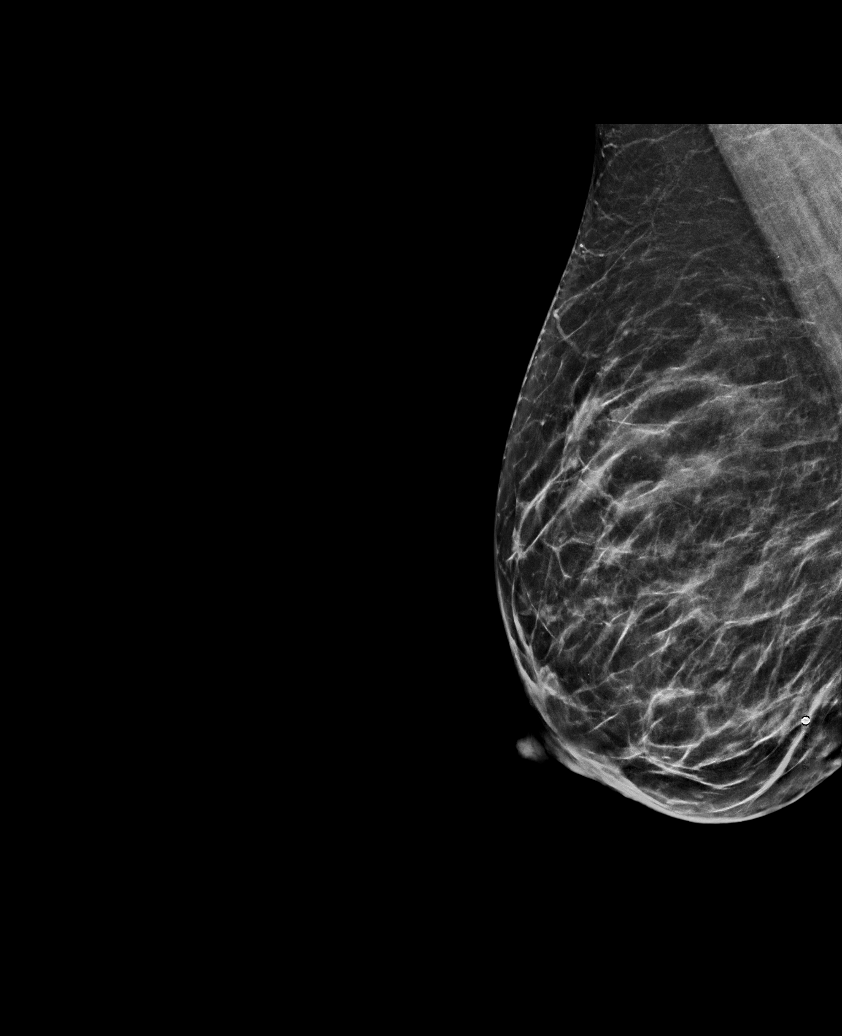

[R MLO synth-2D (2 of 2)]
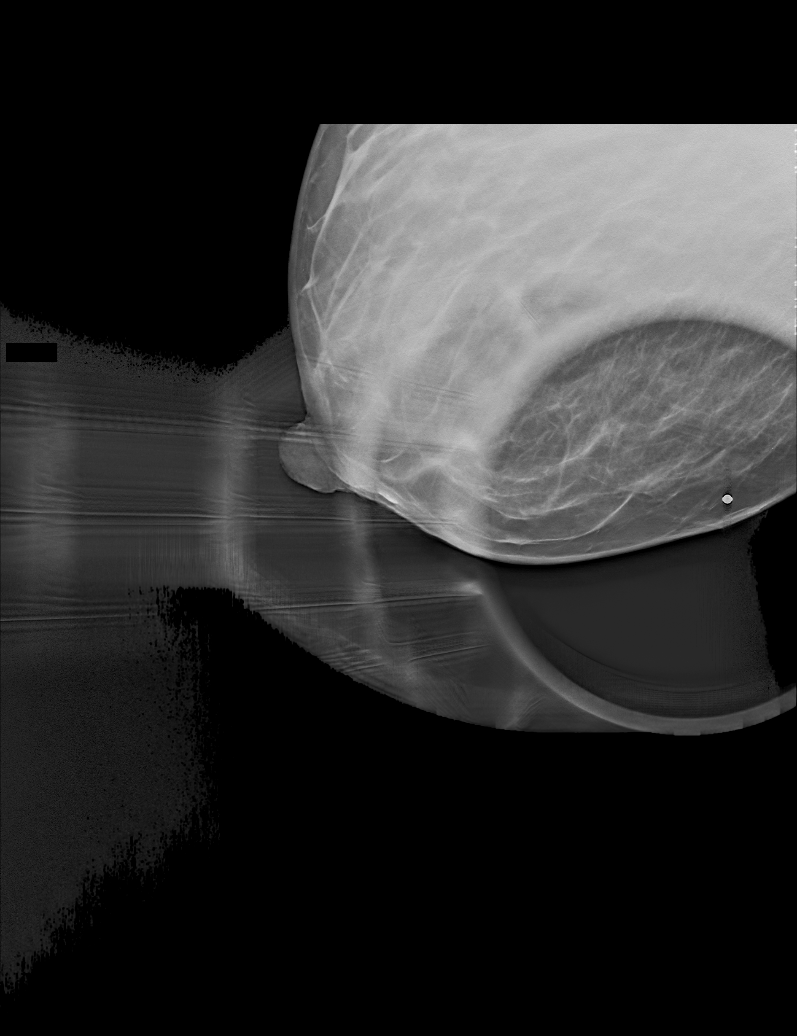

[R MLO tomo · tomo slice 23/46.0]
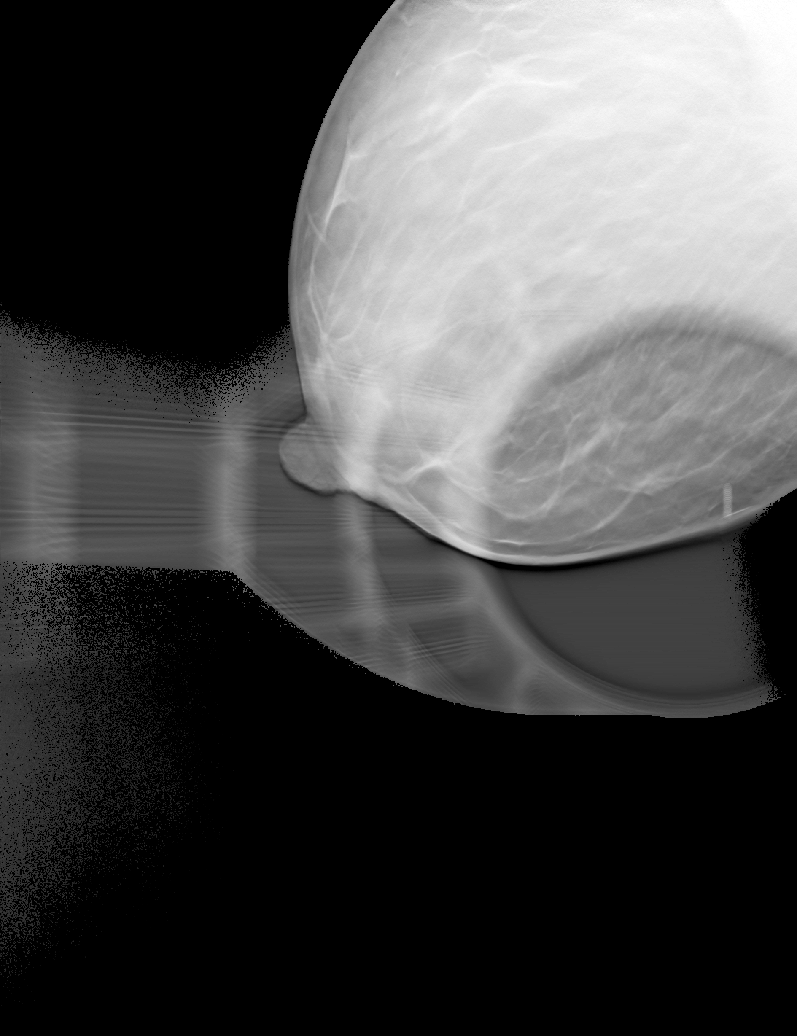

[6 of 30 positions shown; findings below may reference images not displayed]

ACR Breast Density Category c: The breast tissue is heterogeneously
dense, which may obscure small masses.
FINDINGS: Spot compression tomosynthesis views were obtained over the palpable
area of concern in the RIGHT breast. No suspicious mammographic
finding is identified in this area. No suspicious mass,
microcalcification, or other finding is identified in either breast.

On physical exam, no suspicious mass is appreciated.

Targeted RIGHT breast ultrasound was performed in the palpable area
of concern at the lower breast. No suspicious solid or cystic mass
is identified. There are scattered tiny simple cysts with
representative simple cyst documented at 10 o'clock 8 cm from the
nipple which measures 5 x 2 by 5 mm.
IMPRESSION: 1. No mammographic or sonographic evidence of malignancy at the site
of palpable concern in the RIGHT breast. There are scattered
subcentimeter benign cysts. Any further workup of the patient's
symptoms should be based on the clinical assessment. Recommend
routine annual screening mammogram in 1 year.
2. No mammographic evidence of malignancy bilaterally.

RECOMMENDATION:
Screening mammogram in one year.(Code:[id number])

I have discussed the findings and recommendations with the patient.
If applicable, a reminder letter will be sent to the patient
regarding the next appointment.

BI-RADS CATEGORY  2: Benign.

## 2023-01-03 ENCOUNTER — Ambulatory Visit: Payer: No Typology Code available for payment source | Admitting: Nurse Practitioner

## 2023-01-03 VITALS — BP 130/89 | HR 93 | Temp 98.5°F | Ht 62.0 in | Wt 165.6 lb

## 2023-01-03 DIAGNOSIS — Z0000 Encounter for general adult medical examination without abnormal findings: Secondary | ICD-10-CM

## 2023-01-03 DIAGNOSIS — G4700 Insomnia, unspecified: Secondary | ICD-10-CM

## 2023-01-03 DIAGNOSIS — Z136 Encounter for screening for cardiovascular disorders: Secondary | ICD-10-CM | POA: Diagnosis not present

## 2023-01-03 DIAGNOSIS — Z1231 Encounter for screening mammogram for malignant neoplasm of breast: Secondary | ICD-10-CM | POA: Diagnosis not present

## 2023-01-03 DIAGNOSIS — Z1159 Encounter for screening for other viral diseases: Secondary | ICD-10-CM | POA: Diagnosis not present

## 2023-01-03 LAB — MICROSCOPIC EXAMINATION
Bacteria, UA: NONE SEEN
WBC, UA: NONE SEEN /[HPF] (ref 0–5)

## 2023-01-03 LAB — URINALYSIS, ROUTINE W REFLEX MICROSCOPIC
Bilirubin, UA: NEGATIVE
Glucose, UA: NEGATIVE
Ketones, UA: NEGATIVE
Leukocytes,UA: NEGATIVE
Nitrite, UA: NEGATIVE
Specific Gravity, UA: 1.025 (ref 1.005–1.030)
Urobilinogen, Ur: 1 mg/dL (ref 0.2–1.0)
pH, UA: 6.5 (ref 5.0–7.5)

## 2023-01-03 MED ORDER — TRAZODONE HCL 50 MG PO TABS: 25.0000 mg | ORAL_TABLET | Freq: Every evening | ORAL | 1 refills | Status: AC | PRN

## 2023-01-03 NOTE — Patient Instructions (Signed)
Please call to schedule your mammogram and/or bone density: [facility]  Address: [address], [place], [place] [address] Phone: [phone]  [facility] [place] [address] [place],  [place]  [address] Phone: [phone]   

## 2023-01-03 NOTE — Progress Notes (Signed)
BP 130/89 (BP Location: Left Arm, Patient Position: Sitting, Cuff Size: Normal)   Pulse 93   Temp 98.5 F (36.9 C) (Oral)   Ht 5\' 2"  (1.575 m)   Wt 165 lb 9.6 oz (75.1 kg)   SpO2 99%   BMI 30.29 kg/m    Subjective:    Patient ID: Lindsay Jennings, female    DOB: 03-04-1975, 47 y.o.   MRN: [id number]  HPI: Lindsay Jennings is a 47 y.o. female presenting on 01/03/2023 for comprehensive medical examination. Current medical complaints include: fatigue  She currently lives with: Menopausal Symptoms: no  INSOMNIA Duration: months Satisfied with sleep quality: no Difficulty falling asleep: yes Difficulty staying asleep: yes Waking a few hours after sleep onset: yes Early morning awakenings: yes Daytime hypersomnolence: no Wakes feeling refreshed: no Good sleep hygiene: yes Apnea: no Snoring: no Depressed/anxious mood: no Recent stress: no Restless legs/nocturnal leg cramps: no Chronic pain/arthritis: no History of sleep study: no Treatments attempted:  trazodone and melatonin    Depression Screen done today and results listed below:     10/29/2020    8:50 AM 09/29/2020    8:31 AM  Depression screen PHQ 2/9  Decreased Interest 0 0  Down, Depressed, Hopeless 0 0  PHQ - 2 Score 0 0  Altered sleeping 1   Tired, decreased energy 1   Change in appetite 0   Feeling bad or failure about yourself  0   Trouble concentrating 0   Moving slowly or fidgety/restless 0   Suicidal thoughts 0   PHQ-9 Score 2   Difficult doing work/chores Not difficult at all     The patient does not have a history of falls. I did complete a risk assessment for falls. A plan of care for falls was documented.   Past Medical History:  Past Medical History:  Diagnosis Date   CIN II (cervical intraepithelial neoplasia II)    H/O LEEP    History of abnormal cervical Pap smear    Left breast mass 04/26/2016    Surgical History:  Past Surgical History:  Procedure Laterality Date    CERVICAL BIOPSY  W/ LOOP ELECTRODE EXCISION  12/05/2012   CONE LEEP   CESAREAN SECTION N/A 09/28/2016   Procedure: CESAREAN SECTION;  Surgeon: Vena Austria, MD;  Location: [facility] ORS;  Service: Obstetrics;  Laterality: N/A;   COLONOSCOPY WITH PROPOFOL N/A 01/15/2021   Procedure: COLONOSCOPY WITH PROPOFOL;  Surgeon: Wyline Mood, MD;  Location: [facility] ENDOSCOPY;  Service: Gastroenterology;  Laterality: N/A;    Medications:  No current outpatient medications on file prior to visit.   No current facility-administered medications on file prior to visit.    Allergies:  No Known Allergies  Social History:  Social History   Socioeconomic History   Marital status: Married    Spouse name: Not on file   Number of children: 2   Years of education: Not on file   Highest education level: 12th grade  Occupational History   Not on file  Tobacco Use   Smoking status: Never   Smokeless tobacco: Never  Vaping Use   Vaping status: Never Used  Substance and Sexual Activity   Alcohol use: No   Drug use: No   Sexual activity: Yes    Birth control/protection: None, Condom  Other Topics Concern   Not on file  Social History Narrative   Not on file   Social Drivers of Health   Financial Resource Strain: Not on file  Food Insecurity: No Food Insecurity (01/03/2023)   Hunger Vital Sign    Worried About Running Out of Food in the Last Year: Never true    Ran Out of Food in the Last Year: Never true  Transportation Needs: No Transportation Needs (01/03/2023)   PRAPARE - Administrator, Civil Service (Medical): No    Lack of Transportation (Non-Medical): No  Physical Activity: Insufficiently Active (01/03/2023)   Exercise Vital Sign    Days of Exercise per Week: 3 days    Minutes of Exercise per Session: 30 min  Stress: Stress Concern Present (01/03/2023)   [employer] of Occupational Health - Occupational Stress Questionnaire    Feeling of Stress : To some extent   Social Connections: Unknown (01/03/2023)   Social Connection and Isolation Panel [NHANES]    Frequency of Communication with Friends and Family: More than three times a week    Frequency of Social Gatherings with Friends and Family: Twice a week    Attends Religious Services: Not on Marketing executive or Organizations: No    Attends Banker Meetings: Not on file    Marital Status: Married  Intimate Partner Violence: Unknown (04/23/2021)   Received from [facility]   HITS    Physically Hurt: Not on file    Insult or Talk Down To: Not on file    Threaten Physical Harm: Not on file    Scream or Curse: Not on file   Social History   Tobacco Use  Smoking Status Never  Smokeless Tobacco Never   Social History   Substance and Sexual Activity  Alcohol Use No    Family History:  Family History  Problem Relation Age of Onset   Colon cancer Mother 1   Stroke Father    Lung cancer Maternal Uncle    Breast cancer Maternal Grandmother 54       cancer of the gall bladder    Past medical history, surgical history, medications, allergies, family history and social history reviewed with patient today and changes made to appropriate areas of the chart.   Review of Systems  Psychiatric/Behavioral:  The patient has insomnia.    All other ROS negative except what is listed above and in the HPI.      Objective:    BP 130/89 (BP Location: Left Arm, Patient Position: Sitting, Cuff Size: Normal)   Pulse 93   Temp 98.5 F (36.9 C) (Oral)   Ht 5\' 2"  (1.575 m)   Wt 165 lb 9.6 oz (75.1 kg)   SpO2 99%   BMI 30.29 kg/m   Wt Readings from Last 3 Encounters:  01/03/23 165 lb 9.6 oz (75.1 kg)  01/15/21 165 lb 5.5 oz (75 kg)  10/29/20 167 lb (75.8 kg)    Physical Exam Vitals and nursing note reviewed.  Constitutional:      General: She is awake. She is not in acute distress.    Appearance: Normal appearance. She is well-developed. She is not ill-appearing.   HENT:     Head: Normocephalic and atraumatic.     Right Ear: Hearing, tympanic membrane, ear canal and external ear normal. No drainage.     Left Ear: Hearing, tympanic membrane, ear canal and external ear normal. No drainage.     Nose: Nose normal.     Right Sinus: No maxillary sinus tenderness or frontal sinus tenderness.     Left Sinus: No maxillary sinus tenderness or frontal sinus  tenderness.     Mouth/Throat:     Mouth: Mucous membranes are moist.     Pharynx: Oropharynx is clear. Uvula midline. No pharyngeal swelling, oropharyngeal exudate or posterior oropharyngeal erythema.  Eyes:     General: Lids are normal.        Right eye: No discharge.        Left eye: No discharge.     Extraocular Movements: Extraocular movements intact.     Conjunctiva/sclera: Conjunctivae normal.     Pupils: Pupils are equal, round, and reactive to light.     Visual Fields: Right eye visual fields normal and left eye visual fields normal.  Neck:     Thyroid: No thyromegaly.     Vascular: No carotid bruit.     Trachea: Trachea normal.  Cardiovascular:     Rate and Rhythm: Normal rate and regular rhythm.     Heart sounds: Normal heart sounds. No murmur heard.    No gallop.  Pulmonary:     Effort: Pulmonary effort is normal. No accessory muscle usage or respiratory distress.     Breath sounds: Normal breath sounds.  Chest:  Breasts:    Right: Normal.     Left: Normal.  Abdominal:     General: Bowel sounds are normal.     Palpations: Abdomen is soft. There is no hepatomegaly or splenomegaly.     Tenderness: There is no abdominal tenderness.  Musculoskeletal:        General: Normal range of motion.     Cervical back: Normal range of motion and neck supple.     Right lower leg: No edema.     Left lower leg: No edema.  Lymphadenopathy:     Head:     Right side of head: No submental, submandibular, tonsillar, preauricular or posterior auricular adenopathy.     Left side of head: No submental,  submandibular, tonsillar, preauricular or posterior auricular adenopathy.     Cervical: No cervical adenopathy.     Upper Body:     Right upper body: No supraclavicular, axillary or pectoral adenopathy.     Left upper body: No supraclavicular, axillary or pectoral adenopathy.  Skin:    General: Skin is warm and dry.     Capillary Refill: Capillary refill takes less than 2 seconds.     Findings: No rash.  Neurological:     Mental Status: She is alert and oriented to person, place, and time.     Gait: Gait is intact.  Psychiatric:        Attention and Perception: Attention normal.        Mood and Affect: Mood normal.        Speech: Speech normal.        Behavior: Behavior normal. Behavior is cooperative.        Thought Content: Thought content normal.        Judgment: Judgment normal.     Results for orders placed or performed during the hospital encounter of 01/15/21  Pregnancy, urine POC   Collection Time: 01/15/21  7:05 AM  Result Value Ref Range   Preg Test, Ur NEGATIVE NEGATIVE  Surgical pathology   Collection Time: 01/15/21  7:44 AM  Result Value Ref Range   SURGICAL PATHOLOGY      SURGICAL PATHOLOGY CASE: [id number] PATIENT: Or[facility] Surgical Pathology Report     Specimen Submitted: A. Colon polyp, ascending; cold snare B. Rectum polyp; cold snare  Clinical History: Colon cancer screening Z12.11; family history  of colon cancer Z80.0.  Polyps      DIAGNOSIS: A. COLON POLYP, ASCENDING; COLD SNARE: - TUBULAR ADENOMA. - NEGATIVE FOR HIGH GRADE DYSPLASIA AND MALIGNANCY.  B. RECTUM POLYP; COLD SNARE: - HYPERPLASTIC POLYP. - NEGATIVE FOR DYSPLASIA AND MALIGNANCY.   GROSS DESCRIPTION: A. Labeled: Ascending colon polyp cold snare Received: Formalin Collection time: 7:44 AM on 01/15/2021 Placed into formalin time: 7:44 AM on 01/15/2021 Tissue fragment(s): Multiple Size: Aggregate, 3 x 1.5 x 0.2 cm Description: Received is a single fragment of tan  soft tissue admixed with intestinal debris.  The ratio of soft tissue to intestinal debris is 20: 80.  The soft tissue fragment has a resection margin which is inked blue.  This fragm ent is bisected. Entirely submitted in 1 cassette.  B. Labeled: Rectum polyp cold snare Received: Formalin Collection time: 7:55 AM on 01/15/2021 Placed into formalin time: 7:55 AM on 01/15/2021 Tissue fragment(s): Multiple Size: Aggregate, 1 x 0.9 x 0.1 cm Description: Received is at least one fragment of tan soft tissue admixed with intestinal debris.  The ratio of soft tissue to intestinal debris is 40:60. Entirely submitted in 1 cassette.  RB 01/15/2021  Final Diagnosis performed by Georgeanna Harrison, MD.   Electronically signed 01/19/2021 12:37:31PM The electronic signature indicates that the named Attending Pathologist has evaluated the specimen Technical component performed at [facility], [address] [place], [place] [address] Lab: [phone] Dir: Jolene Schimke, MD, MMM  Professional component performed at [facility], [facility], [address] [place], [place], [place] [address] Lab: [phone] Dir: Beryle Quant, MD       Assessment & Plan:   Problem List Items Addressed This Visit       Other   Insomnia   Chronic. Not well controlled.  Will restart Trazodone.  Patient has taken it in the past which did help.  Side effects and benefits of medication during visit.  Follow up in 6 months.  Call sooner if concerns arise.      Other Visit Diagnoses       Annual physical exam    -  Primary   Health maintenance reviewed during visit today.  Labs ordered.  Vaccines reviewed during visit.  PAP up to date.   Relevant Orders   CBC with Differential/Platelet   Comprehensive metabolic panel   Lipid panel   TSH   Urinalysis, Routine w reflex microscopic   Hepatitis C Antibody     Encounter for hepatitis C screening test for low risk patient       Relevant Orders   Hepatitis C  Antibody     Screening for ischemic heart disease       Relevant Orders   Lipid panel     Encounter for screening mammogram for malignant neoplasm of breast       Relevant Orders   MM 3D SCREENING MAMMOGRAM BILATERAL BREAST        Follow up plan: Return in about 6 months (around 07/04/2023) for Sleep Follow up.   LABORATORY TESTING:  - Pap smear: up to date  IMMUNIZATIONS:   - Tdap: Tetanus vaccination status reviewed: last tetanus booster within 10 years. - Influenza: Refused - Pneumovax: Not applicable - Prevnar: Not applicable - COVID: Refused - HPV: Not applicable - Shingrix vaccine: Not applicable  SCREENING: -Mammogram: Ordered today  - Colonoscopy: Not applicable  - Bone Density: Not applicable  -Hearing Test: Not applicable  -Spirometry: Not applicable   PATIENT COUNSELING:   Advised to take 1  mg of folate supplement per day if capable of pregnancy.   Sexuality: Discussed sexually transmitted diseases, partner selection, use of condoms, avoidance of unintended pregnancy  and contraceptive alternatives.   Advised to avoid cigarette smoking.  I discussed with the patient that most people either abstain from alcohol or drink within safe limits (<=14/week and <=4 drinks/occasion for males, <=7/weeks and <= 3 drinks/occasion for females) and that the risk for alcohol disorders and other health effects rises proportionally with the number of drinks per week and how often a drinker exceeds daily limits.  Discussed cessation/primary prevention of drug use and availability of treatment for abuse.   Diet: Encouraged to adjust caloric intake to maintain  or achieve ideal body weight, to reduce intake of dietary saturated fat and total fat, to limit sodium intake by avoiding high sodium foods and not adding table salt, and to maintain adequate dietary potassium and calcium preferably from fresh fruits, vegetables, and low-fat dairy products.    stressed the importance of  regular exercise  Injury prevention: Discussed safety belts, safety helmets, smoke detector, smoking near bedding or upholstery.   Dental health: Discussed importance of regular tooth brushing, flossing, and dental visits.    NEXT PREVENTATIVE PHYSICAL DUE IN 1 YEAR. Return in about 6 months (around 07/04/2023) for Sleep Follow up.

## 2023-01-03 NOTE — Assessment & Plan Note (Signed)
Chronic. Not well controlled.  Will restart Trazodone.  Patient has taken it in the past which did help.  Side effects and benefits of medication during visit.  Follow up in 6 months.  Call sooner if concerns arise.

## 2023-01-04 LAB — COMPREHENSIVE METABOLIC PANEL
ALT: 10 [IU]/L (ref 0–32)
AST: 14 [IU]/L (ref 0–40)
Albumin: 4.1 g/dL (ref 3.9–4.9)
Alkaline Phosphatase: 106 [IU]/L (ref 44–121)
BUN/Creatinine Ratio: 11 (ref 9–23)
BUN: 9 mg/dL (ref 6–24)
Bilirubin Total: 0.2 mg/dL (ref 0.0–1.2)
CO2: 24 mmol/L (ref 20–29)
Calcium: 9.5 mg/dL (ref 8.7–10.2)
Chloride: 102 mmol/L (ref 96–106)
Creatinine, Ser: 0.79 mg/dL (ref 0.57–1.00)
Globulin, Total: 2.8 g/dL (ref 1.5–4.5)
Glucose: 103 mg/dL — ABNORMAL HIGH (ref 70–99)
Potassium: 4.2 mmol/L (ref 3.5–5.2)
Sodium: 141 mmol/L (ref 134–144)
Total Protein: 6.9 g/dL (ref 6.0–8.5)
eGFR: 92 mL/min/{1.73_m2} (ref >59)

## 2023-01-04 LAB — CBC WITH DIFFERENTIAL/PLATELET
Basophils Absolute: 0.1 10*3/uL (ref 0.0–0.2)
Basos: 1 %
EOS (ABSOLUTE): 0.2 10*3/uL (ref 0.0–0.4)
Eos: 2 %
Hematocrit: 39.4 % (ref 34.0–46.6)
Hemoglobin: 12.4 g/dL (ref 11.1–15.9)
Immature Grans (Abs): 0 10*3/uL (ref 0.0–0.1)
Immature Granulocytes: 0 %
Lymphocytes Absolute: 2.7 10*3/uL (ref 0.7–3.1)
Lymphs: 34 %
MCH: 28 pg (ref 26.6–33.0)
MCHC: 31.5 g/dL (ref 31.5–35.7)
MCV: 89 fL (ref 79–97)
Monocytes Absolute: 0.7 10*3/uL (ref 0.1–0.9)
Monocytes: 8 %
Neutrophils Absolute: 4.4 10*3/uL (ref 1.4–7.0)
Neutrophils: 55 %
Platelets: 187 10*3/uL (ref 150–450)
RBC: 4.43 x10E6/uL (ref 3.77–5.28)
RDW: 13.1 % (ref 11.7–15.4)
WBC: 8 10*3/uL (ref 3.4–10.8)

## 2023-01-04 LAB — LIPID PANEL
Chol/HDL Ratio: 2.5 {ratio} (ref 0.0–4.4)
Cholesterol, Total: 154 mg/dL (ref 100–199)
HDL: 61 mg/dL (ref >39)
LDL Chol Calc (NIH): 81 mg/dL (ref 0–99)
Triglycerides: 56 mg/dL (ref 0–149)
VLDL Cholesterol Cal: 12 mg/dL (ref 5–40)

## 2023-01-04 LAB — HEPATITIS C ANTIBODY: Hep C Virus Ab: NONREACTIVE

## 2023-01-04 LAB — TSH: TSH: 2.52 u[IU]/mL (ref 0.450–4.500)

## 2023-06-07 ENCOUNTER — Ambulatory Visit
Admission: RE | Admit: 2023-06-07 | Discharge: 2023-06-07 | Disposition: A | Discharge status: Home / Self Care | Source: Ambulatory Visit | Attending: Nurse Practitioner | Admitting: Nurse Practitioner

## 2023-06-07 DIAGNOSIS — Z1231 Encounter for screening mammogram for malignant neoplasm of breast: Secondary | ICD-10-CM | POA: Insufficient documentation

## 2023-07-04 ENCOUNTER — Ambulatory Visit: Payer: Self-pay | Admitting: Nurse Practitioner
